# Patient Record
Sex: Female | Born: 1950 | Race: Black or African American | Hispanic: No | Marital: Single | Smoking: Former smoker
Health system: Southern US, Community
[De-identification: ages and names within clinical notes are randomized; demographics above are authoritative.]

## PROBLEM LIST (undated history)

## (undated) DIAGNOSIS — E119 Type 2 diabetes mellitus without complications: Secondary | ICD-10-CM

## (undated) DIAGNOSIS — H9190 Unspecified hearing loss, unspecified ear: Secondary | ICD-10-CM

## (undated) HISTORY — DX: Type 2 diabetes mellitus without complications: E11.9

## (undated) HISTORY — DX: Unspecified hearing loss, unspecified ear: H91.90

---

## 2021-03-27 ENCOUNTER — Ambulatory Visit: Payer: Self-pay | Admitting: Family Medicine

## 2021-07-18 ENCOUNTER — Ambulatory Visit: Payer: Self-pay | Admitting: Family Medicine

## 2021-07-22 ENCOUNTER — Other Ambulatory Visit: Payer: Self-pay

## 2021-07-23 ENCOUNTER — Ambulatory Visit (INDEPENDENT_AMBULATORY_CARE_PROVIDER_SITE_OTHER): Payer: Medicare Other | Admitting: Family Medicine

## 2021-07-23 ENCOUNTER — Encounter: Payer: Self-pay | Admitting: Family Medicine

## 2021-07-23 VITALS — BP 122/70 | HR 82 | Temp 97.5°F | Ht 61.0 in | Wt 185.2 lb

## 2021-07-23 DIAGNOSIS — F209 Schizophrenia, unspecified: Secondary | ICD-10-CM

## 2021-07-23 DIAGNOSIS — E7800 Pure hypercholesterolemia, unspecified: Secondary | ICD-10-CM

## 2021-07-23 DIAGNOSIS — E1165 Type 2 diabetes mellitus with hyperglycemia: Secondary | ICD-10-CM

## 2021-07-23 DIAGNOSIS — R9089 Other abnormal findings on diagnostic imaging of central nervous system: Secondary | ICD-10-CM

## 2021-07-23 DIAGNOSIS — K5901 Slow transit constipation: Secondary | ICD-10-CM

## 2021-07-23 DIAGNOSIS — H6123 Impacted cerumen, bilateral: Secondary | ICD-10-CM

## 2021-07-23 DIAGNOSIS — R930 Abnormal findings on diagnostic imaging of skull and head, not elsewhere classified: Secondary | ICD-10-CM

## 2021-07-23 LAB — URINALYSIS, ROUTINE W REFLEX MICROSCOPIC
Bilirubin Urine: NEGATIVE
Hgb urine dipstick: NEGATIVE
Ketones, ur: NEGATIVE
Leukocytes,Ua: NEGATIVE
Nitrite: NEGATIVE
RBC / HPF: NONE SEEN (ref 0–?)
Specific Gravity, Urine: >=1.03 — AB (ref 1.000–1.030)
Total Protein, Urine: NEGATIVE
Urine Glucose: NEGATIVE
Urobilinogen, UA: 0.2 (ref 0.0–1.0)
pH: 5.5 (ref 5.0–8.0)

## 2021-07-23 LAB — LIPID PANEL
Cholesterol: 155 mg/dL (ref 0–200)
HDL: 54.4 mg/dL (ref >39.00)
LDL Cholesterol: 78 mg/dL (ref 0–99)
NonHDL: 100.77
Total CHOL/HDL Ratio: 3
Triglycerides: 115 mg/dL (ref 0.0–149.0)
VLDL: 23 mg/dL (ref 0.0–40.0)

## 2021-07-23 LAB — COMPREHENSIVE METABOLIC PANEL
ALT: 15 U/L (ref 0–35)
AST: 15 U/L (ref 0–37)
Albumin: 4.6 g/dL (ref 3.5–5.2)
Alkaline Phosphatase: 82 U/L (ref 39–117)
BUN: 13 mg/dL (ref 6–23)
CO2: 32 mEq/L (ref 19–32)
Calcium: 10.1 mg/dL (ref 8.4–10.5)
Chloride: 103 mEq/L (ref 96–112)
Creatinine, Ser: 0.73 mg/dL (ref 0.40–1.20)
GFR: 84.15 mL/min (ref >60.00)
Glucose, Bld: 101 mg/dL — ABNORMAL HIGH (ref 70–99)
Potassium: 4.4 mEq/L (ref 3.5–5.1)
Sodium: 141 mEq/L (ref 135–145)
Total Bilirubin: 0.4 mg/dL (ref 0.2–1.2)
Total Protein: 7.5 g/dL (ref 6.0–8.3)

## 2021-07-23 LAB — LDL CHOLESTEROL, DIRECT: Direct LDL: 84 mg/dL

## 2021-07-23 LAB — CBC
HCT: 39 % (ref 36.0–46.0)
Hemoglobin: 12.2 g/dL (ref 12.0–15.0)
MCHC: 31.3 g/dL (ref 30.0–36.0)
MCV: 80.6 fl (ref 78.0–100.0)
Platelets: 281 10*3/uL (ref 150.0–400.0)
RBC: 4.83 Mil/uL (ref 3.87–5.11)
RDW: 14.3 % (ref 11.5–15.5)
WBC: 7.8 10*3/uL (ref 4.0–10.5)

## 2021-07-23 LAB — MICROALBUMIN / CREATININE URINE RATIO
Creatinine,U: 132.3 mg/dL
Microalb Creat Ratio: 1 mg/g (ref 0.0–30.0)
Microalb, Ur: 1.4 mg/dL (ref 0.0–1.9)

## 2021-07-23 LAB — HEMOGLOBIN A1C: Hgb A1c MFr Bld: 7.4 % — ABNORMAL HIGH (ref 4.6–6.5)

## 2021-07-23 NOTE — Progress Notes (Addendum)
New Patient Office Visit  Subjective:  Patient ID: Margaret Bailey, female    DOB: Jun 07, 1951  Age: 70 y.o. MRN: [id number]  CC:  Chief Complaint  Patient presents with   Establish Care    NP/establish care would like referral to podiatrist to check toe nails.     HPI Margaret Bailey presents for establishment of care follow-up for multiple medical issues to include type 2 diabetes, elevated cholesterol, constipation, presbycusis, ceruminosis and history of abnormal brain MRI from 2 years ago.  Review of her medical record shows that an abnormality has been noted and an MRI of the pituitary gland in 2018.  Patient suffers from schizophrenia and she is seeing a psychiatrist here in [place].  Diabetes has been treated with Glucophage.  She takes simvastatin for elevated cholesterol.  She would like to see a podiatrist to help her trim her nails.  She takes Linzess and Colace for chronic constipation once or twice a week she tells me.  Chart review shows history of neuropathy treated with lidocaine patches and Cymbalta.  She is not certain as to when her last diabetic eye exam was performed.  Moved down here from [place] in September.  She is living with her daughter.  Past Medical History:  Diagnosis Date   Diabetes mellitus without complication ([place])    Hearing loss     Past Surgical History:  Procedure Laterality Date   CESAREAN SECTION      History reviewed. No pertinent family history.  Social History   Socioeconomic History   Marital status: Single    Spouse name: Not on file   Number of children: Not on file   Years of education: Not on file   Highest education level: Not on file  Occupational History   Not on file  Tobacco Use   Smoking status: Former    Types: Cigarettes    Quit date: 08/31/1998    Years since quitting: 22.9   Smokeless tobacco: Never  Vaping Use   Vaping Use: Never used  Substance and Sexual Activity   Alcohol use: Yes    Alcohol/week: 1.0  standard drink    Types: 1 Glasses of wine per week    Comment: rare once a year   Drug use: Never   Sexual activity: Not Currently  Other Topics Concern   Not on file  Social History Narrative   Not on file   Social Determinants of Health   Financial Resource Strain: Not on file  Food Insecurity: Not on file  Transportation Needs: Not on file  Physical Activity: Not on file  Stress: Not on file  Social Connections: Not on file  Intimate Partner Violence: Not on file    ROS Review of Systems  Constitutional:  Negative for chills, diaphoresis, fatigue, fever and unexpected weight change.  HENT: Negative.    Eyes:  Negative for photophobia and visual disturbance.  Respiratory:  Negative for chest tightness, shortness of breath and wheezing.   Cardiovascular: Negative.   Gastrointestinal:  Positive for constipation. Negative for abdominal pain, anal bleeding, blood in stool and diarrhea.  Genitourinary:  Negative for difficulty urinating, frequency and urgency.  Musculoskeletal:  Negative for gait problem and joint swelling.  Neurological:  Negative for speech difficulty and weakness.  Hematological:  Does not bruise/bleed easily.  Depression screen [facility] 2/9 07/23/2021 07/23/2021  Decreased Interest 0 0  Down, Depressed, Hopeless 0 0  PHQ - 2 Score 0 0  Altered sleeping 0 -  Tired, decreased energy 0 -  Change in appetite 0 -  Feeling bad or failure about yourself  0 -  Trouble concentrating 0 -  Moving slowly or fidgety/restless 0 -  Suicidal thoughts 0 -  PHQ-9 Score 0 -  Difficult doing work/chores Not difficult at all -     Objective:   Today's Vitals: BP 122/70 (BP Location: Right Arm, Patient Position: Sitting, Cuff Size: Normal)   Pulse 82   Temp (!) 97.5 F (36.4 C) (Temporal)   Ht 5\' 1"  (1.549 m)   Wt 185 lb 3.2 oz (84 kg)   SpO2 97%   BMI 34.99 kg/m   Physical Exam Vitals and nursing note reviewed.  Constitutional:      General: She is not in  acute distress.    Appearance: Normal appearance. She is not ill-appearing or toxic-appearing.  HENT:     Head: Normocephalic and atraumatic.     Right Ear: External ear normal. Decreased hearing noted. 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     Left Ear: External ear normal. Decreased hearing noted.     Ears:     Comments: There is cerumen gnosis of both ears.  Left appears to be deformed with ceruminosis.  Right TM appears to be deformed with question of perforation.  There is cerumen gnosis in the canal of the right ear..    Mouth/Throat:     Mouth: Mucous membranes are moist.     Pharynx: Oropharynx is clear. No oropharyngeal exudate or posterior oropharyngeal erythema.  Eyes:     General: No scleral icterus.       Right eye: No discharge.        Left eye: No discharge.     Extraocular Movements: Extraocular movements intact.     Conjunctiva/sclera: Conjunctivae normal.     Pupils: Pupils are equal, round, and reactive to light.  Neck:     Vascular: No carotid bruit.  Cardiovascular:     Rate and Rhythm: Normal rate and regular rhythm.     Pulses:          Dorsalis pedis pulses are 2+ on the right side and 2+ on the left side.       Posterior tibial pulses are 2+ on the right side and 2+ on the left side.  Pulmonary:     Effort: Pulmonary effort is normal.     Breath sounds: Normal breath sounds.  Abdominal:     General: Bowel sounds are normal.  Musculoskeletal:     Cervical back: No rigidity or tenderness.     Right lower leg: No edema.     Left lower leg: No edema.  Lymphadenopathy:     Cervical: No cervical adenopathy.  Skin:    General: Skin is warm and dry.  Neurological:     Mental Status: She is alert and oriented to person, place, and time.  Psychiatric:        Mood and Affect: Mood normal.        Behavior: Behavior normal.   Diabetic Foot Exam - Simple   Simple Foot Form Diabetic Foot exam was performed with the following findings: Yes 07/23/2021  1:48 PM   Visual Inspection See comments: Yes Sensation Testing Intact to touch and monofilament testing bilaterally: Yes Pulse Check Posterior Tibialis and Dorsalis pulse intact bilaterally: Yes Comments All nails with polish.     Assessment & Plan:   Problem List Items Addressed This Visit       Nervous and  Auditory   Excessive cerumen in both ear canals - Primary   Relevant Orders   Ambulatory referral to ENT   Other Visit Diagnoses     Type 2 diabetes mellitus with hyperglycemia, without long-term current use of insulin (HCC)       Relevant Medications   metFORMIN (GLUCOPHAGE) 1000 MG tablet   simvastatin (ZOCOR) 20 MG tablet   Other Relevant Orders   CBC (Completed)   Comprehensive metabolic panel (Completed)   Hemoglobin A1c (Completed)   Urinalysis, Routine w reflex microscopic (Completed)   Microalbumin / creatinine urine ratio (Completed)   Ambulatory referral to Podiatry   Ambulatory referral to Ophthalmology   Elevated cholesterol       Relevant Medications   simvastatin (ZOCOR) 20 MG tablet   Other Relevant Orders   Comprehensive metabolic panel (Completed)   LDL cholesterol, direct (Completed)   Lipid panel (Completed)   Slow transit constipation       Schizophrenia, unspecified type (HCC)       Abnormal finding on MRI of brain       Relevant Orders   MR Brain W Wo Contrast (Completed)   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   DULoxetine (CYMBALTA) 60 MG capsule Take 60 mg by mouth daily.   haloperidol (HALDOL) 10 MG tablet Take 10 mg by mouth at bedtime.   LINZESS 145 MCG CAPS capsule Take 145 mcg by mouth daily as needed (constipation).   metFORMIN (GLUCOPHAGE) 1000 MG tablet Take 1,000 mg by  mouth 2 (two) times daily.   simvastatin (ZOCOR) 20 MG tablet Take 20 mg by mouth at bedtime.   No facility-administered encounter medications on file as of 07/23/2021.    Follow-up: Return in about 3 months (around 10/23/2021), or if symptoms worsen or fail to improve.    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  Dedicated study of the sella was recommended.  That has been ordered.  Called patient and left a message on her phone that more detailed studies were needed.  Asked her to follow-up with a virtual with me after the next MRI.

## 2021-07-29 ENCOUNTER — Emergency Department (HOSPITAL_COMMUNITY): Payer: Medicare Other

## 2021-07-29 ENCOUNTER — Other Ambulatory Visit: Payer: Self-pay

## 2021-07-29 ENCOUNTER — Encounter (HOSPITAL_COMMUNITY): Payer: Self-pay | Admitting: Emergency Medicine

## 2021-07-29 ENCOUNTER — Observation Stay (HOSPITAL_COMMUNITY)
Admission: EM | Admit: 2021-07-29 | Discharge: 2021-07-30 | Disposition: A | Discharge status: Home / Self Care | Payer: Medicare Other | Attending: Internal Medicine | Admitting: Internal Medicine

## 2021-07-29 DIAGNOSIS — Z87891 Personal history of nicotine dependence: Secondary | ICD-10-CM | POA: Insufficient documentation

## 2021-07-29 DIAGNOSIS — Z79899 Other long term (current) drug therapy: Secondary | ICD-10-CM | POA: Diagnosis not present

## 2021-07-29 DIAGNOSIS — R6510 Systemic inflammatory response syndrome (SIRS) of non-infectious origin without acute organ dysfunction: Secondary | ICD-10-CM | POA: Diagnosis present

## 2021-07-29 DIAGNOSIS — N179 Acute kidney failure, unspecified: Secondary | ICD-10-CM | POA: Diagnosis not present

## 2021-07-29 DIAGNOSIS — E1169 Type 2 diabetes mellitus with other specified complication: Secondary | ICD-10-CM | POA: Diagnosis present

## 2021-07-29 DIAGNOSIS — E871 Hypo-osmolality and hyponatremia: Secondary | ICD-10-CM | POA: Insufficient documentation

## 2021-07-29 DIAGNOSIS — Z7984 Long term (current) use of oral hypoglycemic drugs: Secondary | ICD-10-CM | POA: Insufficient documentation

## 2021-07-29 DIAGNOSIS — E785 Hyperlipidemia, unspecified: Secondary | ICD-10-CM | POA: Diagnosis present

## 2021-07-29 DIAGNOSIS — T781XXA Other adverse food reactions, not elsewhere classified, initial encounter: Secondary | ICD-10-CM | POA: Diagnosis present

## 2021-07-29 DIAGNOSIS — Z20822 Contact with and (suspected) exposure to covid-19: Secondary | ICD-10-CM | POA: Diagnosis not present

## 2021-07-29 DIAGNOSIS — R21 Rash and other nonspecific skin eruption: Secondary | ICD-10-CM | POA: Diagnosis not present

## 2021-07-29 DIAGNOSIS — D72829 Elevated white blood cell count, unspecified: Secondary | ICD-10-CM | POA: Diagnosis not present

## 2021-07-29 DIAGNOSIS — E872 Acidosis, unspecified: Secondary | ICD-10-CM

## 2021-07-29 DIAGNOSIS — T7840XA Allergy, unspecified, initial encounter: Secondary | ICD-10-CM

## 2021-07-29 DIAGNOSIS — E119 Type 2 diabetes mellitus without complications: Secondary | ICD-10-CM | POA: Diagnosis not present

## 2021-07-29 DIAGNOSIS — E7800 Pure hypercholesterolemia, unspecified: Secondary | ICD-10-CM | POA: Diagnosis present

## 2021-07-29 DIAGNOSIS — E1165 Type 2 diabetes mellitus with hyperglycemia: Secondary | ICD-10-CM | POA: Diagnosis present

## 2021-07-29 LAB — BASIC METABOLIC PANEL
Anion gap: 13 (ref 5–15)
BUN: 21 mg/dL (ref 8–23)
CO2: 19 mmol/L — ABNORMAL LOW (ref 22–32)
Calcium: 9.2 mg/dL (ref 8.9–10.3)
Chloride: 98 mmol/L (ref 98–111)
Creatinine, Ser: 1.69 mg/dL — ABNORMAL HIGH (ref 0.44–1.00)
GFR, Estimated: 32 mL/min — ABNORMAL LOW (ref >60)
Glucose, Bld: 298 mg/dL — ABNORMAL HIGH (ref 70–99)
Potassium: 4.7 mmol/L (ref 3.5–5.1)
Sodium: 130 mmol/L — ABNORMAL LOW (ref 135–145)

## 2021-07-29 LAB — HEPATIC FUNCTION PANEL
ALT: 15 U/L (ref 0–44)
AST: 23 U/L (ref 15–41)
Albumin: 3.8 g/dL (ref 3.5–5.0)
Alkaline Phosphatase: 77 U/L (ref 38–126)
Bilirubin, Direct: 0.1 mg/dL (ref 0.0–0.2)
Indirect Bilirubin: 0.7 mg/dL (ref 0.3–0.9)
Total Bilirubin: 0.8 mg/dL (ref 0.3–1.2)
Total Protein: 7.5 g/dL (ref 6.5–8.1)

## 2021-07-29 LAB — CBG MONITORING, ED: Glucose-Capillary: 169 mg/dL — ABNORMAL HIGH (ref 70–99)

## 2021-07-29 LAB — CBC
HCT: 45.2 % (ref 36.0–46.0)
Hemoglobin: 14.7 g/dL (ref 12.0–15.0)
MCH: 25.8 pg — ABNORMAL LOW (ref 26.0–34.0)
MCHC: 32.5 g/dL (ref 30.0–36.0)
MCV: 79.3 fL — ABNORMAL LOW (ref 80.0–100.0)
Platelets: 361 10*3/uL (ref 150–400)
RBC: 5.7 MIL/uL — ABNORMAL HIGH (ref 3.87–5.11)
RDW: 13.9 % (ref 11.5–15.5)
WBC: 22.1 10*3/uL — ABNORMAL HIGH (ref 4.0–10.5)
nRBC: 0 % (ref 0.0–0.2)

## 2021-07-29 LAB — RESP PANEL BY RT-PCR (FLU A&B, COVID) ARPGX2
Influenza A by PCR: NEGATIVE
Influenza B by PCR: NEGATIVE
SARS Coronavirus 2 by RT PCR: NEGATIVE

## 2021-07-29 LAB — URINALYSIS, ROUTINE W REFLEX MICROSCOPIC
Bilirubin Urine: NEGATIVE
Glucose, UA: NEGATIVE mg/dL
Hgb urine dipstick: NEGATIVE
Ketones, ur: NEGATIVE mg/dL
Leukocytes,Ua: NEGATIVE
Nitrite: NEGATIVE
Protein, ur: NEGATIVE mg/dL
Specific Gravity, Urine: >1.03 — ABNORMAL HIGH (ref 1.005–1.030)
pH: 5.5 (ref 5.0–8.0)

## 2021-07-29 LAB — LACTIC ACID, PLASMA
Lactic Acid, Venous: 3.7 mmol/L (ref 0.5–1.9)
Lactic Acid, Venous: 2.3 mmol/L (ref 0.5–1.9)

## 2021-07-29 LAB — TROPONIN I (HIGH SENSITIVITY): Troponin I (High Sensitivity): 18 ng/L — ABNORMAL HIGH (ref <18)

## 2021-07-29 LAB — LIPASE, BLOOD: Lipase: 25 U/L (ref 11–51)

## 2021-07-29 MED ORDER — ACETAMINOPHEN 325 MG PO TABS: 650.0000 mg | ORAL_TABLET | Freq: Four times a day (QID) | ORAL | Status: DC | PRN

## 2021-07-29 MED ORDER — ONDANSETRON HCL 4 MG PO TABS: 4.0000 mg | ORAL_TABLET | Freq: Four times a day (QID) | ORAL | Status: DC | PRN

## 2021-07-29 MED ORDER — SODIUM CHLORIDE 0.9 % IV SOLN
INTRAVENOUS | Status: AC
  Filled 2021-07-29: qty 2

## 2021-07-29 MED ORDER — ONDANSETRON HCL 4 MG/2ML IJ SOLN
4.0000 mg | Freq: Once | INTRAMUSCULAR | Status: AC
  Administered 2021-07-29: 4 mg via INTRAVENOUS

## 2021-07-29 MED ORDER — DICLOFENAC SODIUM 1 % EX GEL: 1.0000 "application " | Freq: Four times a day (QID) | CUTANEOUS | Status: DC | PRN

## 2021-07-29 MED ORDER — SENNOSIDES-DOCUSATE SODIUM 8.6-50 MG PO TABS: 1.0000 | ORAL_TABLET | Freq: Every evening | ORAL | Status: DC | PRN

## 2021-07-29 MED ORDER — POTASSIUM CHLORIDE IN NACL 40-0.9 MEQ/L-% IV SOLN
INTRAVENOUS | Status: AC
  Filled 2021-07-29: qty 1000

## 2021-07-29 MED ORDER — HALOPERIDOL 5 MG PO TABS
10.0000 mg | ORAL_TABLET | Freq: Every day | ORAL | Status: DC
  Administered 2021-07-30: 10 mg via ORAL
  Filled 2021-07-29: qty 2

## 2021-07-29 MED ORDER — SODIUM CHLORIDE 0.9 % IV BOLUS
500.0000 mL | Freq: Once | INTRAVENOUS | Status: AC
  Administered 2021-07-29: 500 mL via INTRAVENOUS

## 2021-07-29 MED ORDER — DIPHENHYDRAMINE HCL 25 MG PO CAPS: 25.0000 mg | ORAL_CAPSULE | Freq: Four times a day (QID) | ORAL | Status: DC | PRN

## 2021-07-29 MED ORDER — SODIUM CHLORIDE 0.9% FLUSH
3.0000 mL | Freq: Two times a day (BID) | INTRAVENOUS | Status: DC
  Administered 2021-07-30: 3 mL via INTRAVENOUS

## 2021-07-29 MED ORDER — INSULIN ASPART 100 UNIT/ML IJ SOLN: 0.0000 [IU] | Freq: Three times a day (TID) | INTRAMUSCULAR | Status: DC

## 2021-07-29 MED ORDER — ENOXAPARIN SODIUM 40 MG/0.4ML IJ SOSY
40.0000 mg | PREFILLED_SYRINGE | Freq: Every day | INTRAMUSCULAR | Status: DC
  Administered 2021-07-30: 40 mg via SUBCUTANEOUS
  Filled 2021-07-29: qty 0.4

## 2021-07-29 MED ORDER — ONDANSETRON HCL 4 MG/2ML IJ SOLN: 4.0000 mg | Freq: Four times a day (QID) | INTRAMUSCULAR | Status: DC | PRN

## 2021-07-29 MED ORDER — ACETAMINOPHEN 650 MG RE SUPP: 650.0000 mg | Freq: Four times a day (QID) | RECTAL | Status: DC | PRN

## 2021-07-29 MED ORDER — METRONIDAZOLE 500 MG/100ML IV SOLN
500.0000 mg | Freq: Once | INTRAVENOUS | Status: AC
  Administered 2021-07-29: 500 mg via INTRAVENOUS

## 2021-07-29 MED ORDER — SODIUM CHLORIDE 0.9 % IV SOLN: 2.0000 g | Freq: Two times a day (BID) | INTRAVENOUS | Status: DC

## 2021-07-29 MED ORDER — LACTATED RINGERS IV SOLN: INTRAVENOUS | Status: DC

## 2021-07-29 MED ORDER — INSULIN ASPART 100 UNIT/ML IJ SOLN
8.0000 [IU] | Freq: Once | INTRAMUSCULAR | Status: AC
  Administered 2021-07-29: 8 [IU] via INTRAVENOUS

## 2021-07-29 MED ORDER — METHYLPREDNISOLONE SODIUM SUCC 125 MG IJ SOLR
60.0000 mg | Freq: Once | INTRAMUSCULAR | Status: AC
  Administered 2021-07-29: 60 mg via INTRAVENOUS

## 2021-07-29 MED ORDER — DULOXETINE HCL 60 MG PO CPEP
60.0000 mg | ORAL_CAPSULE | Freq: Every day | ORAL | Status: DC
  Filled 2021-07-29: qty 1

## 2021-07-29 MED ORDER — LINACLOTIDE 145 MCG PO CAPS: 145.0000 ug | ORAL_CAPSULE | Freq: Every day | ORAL | Status: DC | PRN

## 2021-07-29 MED ORDER — VANCOMYCIN HCL 1750 MG/350ML IV SOLN
1750.0000 mg | Freq: Once | INTRAVENOUS | Status: AC
  Administered 2021-07-30: 1750 mg via INTRAVENOUS
  Filled 2021-07-29: qty 350

## 2021-07-29 MED ORDER — SIMVASTATIN 20 MG PO TABS
20.0000 mg | ORAL_TABLET | Freq: Every day | ORAL | Status: DC
  Administered 2021-07-30: 20 mg via ORAL
  Filled 2021-07-29: qty 1

## 2021-07-29 MED ORDER — SODIUM CHLORIDE 0.9 % IV SOLN
2.0000 g | Freq: Once | INTRAVENOUS | Status: AC
  Administered 2021-07-29: 2 g via INTRAVENOUS

## 2021-07-29 MED ORDER — DIPHENHYDRAMINE HCL 50 MG/ML IJ SOLN
25.0000 mg | Freq: Once | INTRAMUSCULAR | Status: AC
  Administered 2021-07-29: 25 mg via INTRAVENOUS

## 2021-07-29 MED ORDER — VANCOMYCIN VARIABLE DOSE PER UNSTABLE RENAL FUNCTION (PHARMACIST DOSING): Status: DC

## 2021-07-29 MED ORDER — FAMOTIDINE IN NACL 20-0.9 MG/50ML-% IV SOLN
20.0000 mg | Freq: Once | INTRAVENOUS | Status: AC
  Administered 2021-07-29: 20 mg via INTRAVENOUS

## 2021-07-29 MED ORDER — ACETAMINOPHEN 500 MG PO TABS
1000.0000 mg | ORAL_TABLET | Freq: Once | ORAL | Status: AC
  Administered 2021-07-29: 1000 mg via ORAL

## 2021-07-29 NOTE — ED Notes (Signed)
Antibiotic started prior to first blood culture so not to delay administration. Labs delayed due to code in department.

## 2021-07-29 NOTE — H&P (Signed)
Non  History and Physical    Margaret Bailey YFV:[id number] DOB: 19-Feb-1951 DOA: 07/29/2021  PCP: Libby Maw, MD  Patient coming from: Home  I have personally briefly reviewed patient's old medical records in [place]  Chief Complaint: Pruritus, hand swelling  HPI: Margaret Bailey is a 70 y.o. female with medical history significant for type 2 diabetes, hyperlipidemia, mood disorder who presented to the ED for evaluation of pruritus and swelling in her hands.  Patient reports a food allergy to lemon.  She says 2 days ago she ate a lemon pepper.  Afterwards she began to feel itchy all over.  She took Benadryl with some relief.  The following day she began to have significant swelling to both of her hands as well as erythema on her palms.  Her daughter noticed that she had erythema of her upper extremities as well as some erythematous changes on her chest.  She then developed scattered flat red spots on her extremities.  She has had some lightheadedness, dizziness which she describes as vertigo but has not lost consciousness or fallen.  She said she felt some difficulty swallowing earlier today but did not feel like she was having any swelling in her lips, tongue, or throat.  She had some nausea without significant emesis.  She says that she has been having some exertional dyspnea.  She has also had some bandlike chest discomfort earlier today.  Patient states that her hand swelling and erythema have now resolved after receiving IV steroids, Pepcid, Benadryl while in the ED.  She still has some pruritus.  She denies any similar reaction in the past.  She otherwise denies any subjective fevers, chills, diaphoresis, cough, abdominal pain, dysuria, diarrhea, or swelling in her legs.  ED Course:  Initial vitals showed BP 104/74, pulse 121, RR 22, temp 98.3 F, SPO2 95% on room air.  T-max 99.9 F while in the ED.  Labs show WBC 22.1, hemoglobin 14.7, platelets 361,000, sodium  130 (135 when corrected for hyperglycemia), potassium 4.7, bicarb 19, BUN 21, creatinine 1.69 (previously 0.73 on 07/23/2021), serum glucose 298, anion gap 13, lipase 25, LFTs within normal limits, lactic acid 3.7.  Blood cultures collected and in process.  Urinalysis and culture ordered and pending collection.  SARS-CoV-2 PCR panel in process.  2 view chest x-ray shows mild elevation of right hemidiaphragm without focal consolidation, edema, effusion.  Patient was given 500 cc normal saline x2 followed by LR@150  mL/hour, 8 units IV NovoLog, 60 mg IV Solu-Medrol, IV Pepcid, Zofran, Tylenol, IV Benadryl.  Patient started on empiric antibiotics with IV vancomycin, cefepime, and Flagyl.  The hospitalist service was consulted to admit for further evaluation of SIRS and AKI.  Review of Systems: All systems reviewed and are negative except as documented in history of present illness above.   Past Medical History:  Diagnosis Date   Diabetes mellitus without complication ([place])    Hearing loss     Past Surgical History:  Procedure Laterality Date   CESAREAN SECTION      Social History:  reports that she quit smoking about 22 years ago. Her smoking use included cigarettes. She has never used smokeless tobacco. She reports current alcohol use of about 1.0 standard drink per week. She reports that she does not use drugs.  Allergies  Allergen Reactions   Lemon Oil Itching    No family history on file.   Prior to Admission medications   Medication Sig Start Date End Date Taking? Authorizing  Provider  diclofenac Sodium (VOLTAREN) 1 % GEL Apply 1 application topically 4 (four) times daily. 04/28/21   [provider]  DULoxetine (CYMBALTA) 60 MG capsule Take 60 mg by mouth daily. 07/15/21   [provider]  haloperidol (HALDOL) 10 MG tablet Take 10 mg by mouth at bedtime. 06/17/21   [provider]  LINZESS 145 MCG CAPS capsule Take 145 mcg by mouth daily. 04/28/21    [provider]  metFORMIN (GLUCOPHAGE) 1000 MG tablet Take 1,000 mg by mouth 2 (two) times daily. 04/19/21   [provider]  simvastatin (ZOCOR) 20 MG tablet Take 20 mg by mouth at bedtime. 04/19/21   [provider]    Physical Exam: Vitals:   07/29/21 1346 07/29/21 1635 07/29/21 1921 07/29/21 2208  BP: 123/73 115/79 125/86 (!) 141/67  Pulse: (!) 120 (!) 113 (!) 114 (!) 104  Resp: 16 14 16 19   Temp:   99.9 F (37.7 C)   TempSrc:   Oral   SpO2: 98% 99% 99% 98%   Constitutional: Resting in bed, NAD, calm, comfortable Eyes: PERRL, lids and conjunctivae normal ENMT: Mucous membranes are moist. Posterior pharynx clear of any exudate or lesions.dentures in place. Neck: normal, supple, no masses. Respiratory: clear to auscultation bilaterally, no wheezing, no crackles. Normal respiratory effort. No accessory muscle use.  Cardiovascular: Tachycardic with regular rhythm, no murmurs / rubs / gallops. No extremity edema. 2+ pedal pulses. Abdomen: no tenderness, no masses palpated. No hepatosplenomegaly. Bowel sounds positive.  Musculoskeletal: no clubbing / cyanosis. No joint deformity upper and lower extremities. Good ROM, no contractures. Normal muscle tone.  Skin: no rashes, lesions, ulcers. No induration Neurologic: CN 2-12 grossly intact. Sensation intact. Strength 5/5 in all 4.  Psychiatric: Normal judgment and insight. Alert and oriented x 3. Normal mood.   Labs on Admission: I have personally reviewed following labs and imaging studies  CBC: Recent Labs  Lab 07/23/21 1355 07/29/21 1137  WBC 7.8 22.1*  HGB 12.2 14.7  HCT 39.0 45.2  MCV 80.6 79.3*  PLT 281.0 144   Basic Metabolic Panel: Recent Labs  Lab 07/23/21 1355 07/29/21 1137  NA 141 130*  K 4.4 4.7  CL 103 98  CO2 32 19*  GLUCOSE 101* 298*  BUN 13 21  CREATININE 0.73 1.69*  CALCIUM 10.1 9.2   GFR: Estimated Creatinine Clearance: 30.9 mL/min (A) (by C-G formula based on SCr of 1.69  mg/dL (H)). Liver Function Tests: Recent Labs  Lab 07/23/21 1355 07/29/21 1742  AST 15 23  ALT 15 15  ALKPHOS 82 77  BILITOT 0.4 0.8  PROT 7.5 7.5  ALBUMIN 4.6 3.8   Recent Labs  Lab 07/29/21 1742  LIPASE 25   No results for input(s): AMMONIA in the last 168 hours. Coagulation Profile: No results for input(s): INR, PROTIME in the last 168 hours. Cardiac Enzymes: No results for input(s): CKTOTAL, CKMB, CKMBINDEX, TROPONINI in the last 168 hours. BNP (last 3 results) No results for input(s): PROBNP in the last 8760 hours. HbA1C: No results for input(s): HGBA1C in the last 72 hours. CBG: Recent Labs  Lab 07/29/21 2142  GLUCAP 169*   Lipid Profile: No results for input(s): CHOL, HDL, LDLCALC, TRIG, CHOLHDL, LDLDIRECT in the last 72 hours. Thyroid Function Tests: No results for input(s): TSH, T4TOTAL, FREET4, T3FREE, THYROIDAB in the last 72 hours. Anemia Panel: No results for input(s): VITAMINB12, FOLATE, FERRITIN, TIBC, IRON, RETICCTPCT in the last 72 hours. Urine analysis:    Component  Value Date/Time   COLORURINE YELLOW 07/23/2021 [place] 07/23/2021 1355   LABSPEC >=1.030 (A) 07/23/2021 1355   PHURINE 5.5 07/23/2021 1355   GLUCOSEU NEGATIVE 07/23/2021 1355   HGBUR NEGATIVE 07/23/2021 [place] 07/23/2021 [place] 07/23/2021 1355   UROBILINOGEN 0.2 07/23/2021 1355   NITRITE NEGATIVE 07/23/2021 1355   LEUKOCYTESUR NEGATIVE 07/23/2021 1355    Radiological Exams on Admission: DG Chest 2 View  Result Date: 07/29/2021 CLINICAL DATA:  Fever. EXAM: CHEST - 2 VIEW COMPARISON:  None. FINDINGS: There is mild eventration of the right hemidiaphragm. No focal consolidation, pleural effusion, pneumothorax. The cardiac silhouette is within normal limits. No acute osseous pathology. IMPRESSION: No active cardiopulmonary disease. Electronically Signed   By: Anner Crete M.D.   On: 07/29/2021 21:23    EKG: Personally  reviewed. Sinus tachycardia, rate 108, no acute ischemic changes.  No prior for comparison.  Assessment/Plan Principal Problem:   SIRS (systemic inflammatory response syndrome) (HCC) Active Problems:   AKI (acute kidney injury) ([place])   Type 2 diabetes mellitus with hyperglycemia ([place])   Hyperlipidemia associated with type 2 diabetes mellitus (HCC)   Shaquera Ansley is a 70 y.o. female with medical history significant for type 2 diabetes, hyperlipidemia, mood disorder who is admitted with SIRS and AKI.  SIRS  Possible food allergic reaction: Patient presenting with tachycardia, tachypnea, leukocytosis, lactic acidosis and AKI without obvious infectious source.  History suggestive of possible non-IgE mediated allergic reaction to food.  Reported hand swelling, erythema and rash of upper extremities, and pruritus have resolved.  CXR without evidence of pneumonia.  COVID and influenza negative.  Urinalysis pending although she denies any urinary symptoms.  She does not have any abdominal symptoms or tenderness on exam. -S/p IV vancomycin, cefepime, Flagyl; hold further antibiotics -Follow blood cultures, urinalysis -Check CBC with differential -Benadryl as needed  Acute kidney injury: Creatinine 1.69 compared to 0.73 on 11/23.  Suspect related to above.  Continue IV fluid hydration and repeat labs in AM.  Type 2 diabetes with hyperglycemia: Hyperglycemic on admission without evidence of DKA or HHS.  Hemoglobin A1c 7.4% on 07/23/2021.  Hold metformin and place on sensitive SSI.  Hyperlipidemia: Continue simvastatin.  Mood disorder: Patient with reported history of schizophrenia, follows with psychiatry locally.  Mood currently stable/appropriate.  Continue home Haldol and Cymbalta.  DVT prophylaxis: Lovenox Code Status: Full code, confirmed on admission Family Communication: Discussed with patient's daughter at bedside Disposition Plan: From home and likely discharged home pending  clinical progress Consults called: None Level of care: Telemetry Medical Admission status:  Status is: Observation  The patient remains OBS appropriate and will d/c before 2 midnights.  Zada Finders MD [facility]  If 7PM-7AM, please contact night-coverage [email or web address]  07/29/2021, 10:30 PM

## 2021-07-29 NOTE — ED Provider Notes (Signed)
Emergency Medicine Provider Triage Evaluation Note  Margaret Bailey , a 70 y.o. female  was evaluated in triage.  Pt complains of itchiness and rash since yesterday.  Patient reports she tried Benadryl at 0845 this morning without relief.  Patient reports she had lemon pepper on Sunday and is allergic to lemons.  She denies any trouble swallowing, reports some shortness of breath.  Patient reports she does have history of vertigo.  Denies any chest pain  Review of Systems  Positive: Shortness of breath, itchiness, rash, dizziness Negative: Chest pain, trouble swallowing, drooling  Physical Exam  BP 104/74 (BP Location: Right Arm)   Pulse (!) 121   Temp 98.3 F (36.8 C) (Oral)   Resp (!) 22   SpO2 95%  Gen:   Awake, no distress   Resp:  Normal effort  MSK:   Moves extremities without difficulty  Other:  Excoriation marks with redness to bilateral palms and dorsum of hands along with few skin spots on arms and abdomen.  Rest of skin exam deferred at this time.  Medical Decision Making  Medically screening exam initiated at 11:23 AM.  Appropriate orders placed.  Margaret Bailey was informed that the remainder of the evaluation will be completed by another provider, this initial triage assessment does not replace that evaluation, and the importance of remaining in the ED until their evaluation is complete.  Dizziness order set placed.  Medications deferred at this time due to lower blood pressure.   Sherrell Puller, PA-C 07/29/21 1125    Carmin Muskrat, MD 08/02/21 1740

## 2021-07-29 NOTE — ED Notes (Signed)
Pt still c/o itching all over despite the benadryl. Did not appreciate any rash.

## 2021-07-29 NOTE — ED Triage Notes (Signed)
C/o feeling itchy all over, redness and swelling to hands x 2 days since eating lemon pepper.  Allergy to lemons.  Also reports vertigo.  Hx of vertigo.  Denies SOB.  No facial swelling.  Took Benadryl at 8:45am.

## 2021-07-29 NOTE — Progress Notes (Signed)
Pharmacy Antibiotic Note  Margaret Bailey is a 70 y.o. female admitted on 07/29/2021 with sepsis.  Pharmacy has been consulted for vancomycin and cefepime dosing. Patient presenting with diffuse itching and rash with hand swelling over the past 2 days.  Patient's serum creatinine is 1.69 which is significantly above baseline. WBC 22.1; LA 3.7; T 99.9 F  Plan: Flagyl per MD Cefepime 2g q12h Vancomycin 1750 mg once, subsequent dosing as indicated per random vancomycin level until renal function stable and/or improved, at which time scheduled dosing can be considered Trend WBC, fever, renal function, and clinical course Follow-up cultures and de-escalate antibiotics as appropriate.     Temp (24hrs), Avg:99.1 F (37.3 C), Min:98.3 F (36.8 C), Max:99.9 F (37.7 C)  Recent Labs  Lab 07/23/21 1355 07/29/21 1137 07/29/21 1747  WBC 7.8 22.1*  --   CREATININE 0.73 1.69*  --   LATICACIDVEN  --   --  3.7*    Estimated Creatinine Clearance: 30.9 mL/min (A) (by C-G formula based on SCr of 1.69 mg/dL (H)).    Not on File  Antimicrobials this admission: Cefepime 11/29 >>  Flagyl 11/29 >>  Vancomycin 11/29 >>   Microbiology results: Pending  Thank you for allowing pharmacy to be a part of this patient's care.  Lorelei Pont, PharmD, BCPS 07/29/2021 8:18 PM ED Clinical Pharmacist -  [phone]

## 2021-07-29 NOTE — ED Notes (Signed)
Pt started c/o pain like a band around her chest/upper abd. EDP notified. EKG obtained and new orders received.

## 2021-07-29 NOTE — ED Provider Notes (Signed)
Emergency Department Provider Note   I have reviewed the triage vital signs and the nursing notes.   HISTORY  Chief Complaint Allergic Reaction   HPI Margaret Bailey is a 70 y.o. female with past medical history reviewed below presents to the emergency department with diffuse itching and rash with hand swelling over the past 2 days.  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  She developed itching and rash shortly afterwards.  She is had some puffiness to the hands.  She is not having any throat tightness/swelling, tongue swelling, shortness of breath.  No abdominal pain or gastrointestinal symptom such as vomiting or diarrhea.  No known prior history of anaphylaxis or EpiPen use.  She is not been having fevers.  She not feeling chest pain or shortness of breath.  She came in due to swelling in the hands as well as diffuse itching.  Her last Benadryl was at 8:45 this morning.    Past Medical History:  Diagnosis Date   Diabetes mellitus without complication ([place])    Hearing loss     Patient Active Problem List   Diagnosis Date Noted   SIRS (systemic inflammatory response syndrome) ([place]) 07/29/2021   AKI (acute kidney injury) ([place]) 07/29/2021   Type 2 diabetes mellitus with hyperglycemia ([place]) 07/29/2021   Hyperlipidemia associated with type 2 diabetes mellitus ([place]) 07/29/2021   Excessive cerumen in both ear canals 07/23/2021    Past Surgical History:  Procedure Laterality Date   CESAREAN SECTION      Allergies Lemon oil  No family history on file.  Social History Social History   Tobacco Use   Smoking status: Former    Types: Cigarettes    Quit date: 08/31/1998    Years since quitting: 22.9   Smokeless tobacco: Never  Vaping Use   Vaping Use: Never used  Substance Use Topics   Alcohol use: Yes    Alcohol/week: 1.0 standard drink    Types: 1 Glasses of wine per week    Comment: rare once a year   Drug use:  Never    Review of Systems  Constitutional: No fever/chills Eyes: No visual changes. ENT: No sore throat. Cardiovascular: Denies chest pain. Respiratory: Denies shortness of breath. Gastrointestinal: No abdominal pain.  No nausea, no vomiting.  No diarrhea.  No constipation. Genitourinary: Negative for dysuria. Musculoskeletal: Negative for back pain. Skin: Itching, bilateral hand swelling, and rash.  Neurological: Negative for headaches, focal weakness or numbness.  10-point ROS otherwise negative.  ____________________________________________   PHYSICAL EXAM:  VITAL SIGNS: ED Triage Vitals  Enc Vitals Group     BP 07/29/21 1037 104/74     Pulse Rate 07/29/21 1037 (!) 121     Resp 07/29/21 1037 (!) 22     Temp 07/29/21 1037 98.3 F (36.8 C)     Temp Source 07/29/21 1037 Oral     SpO2 07/29/21 1037 95 %   Constitutional: Alert and oriented. Well appearing and in no acute distress. Eyes: Conjunctivae are normal.  Head: Atraumatic. Nose: No congestion/rhinnorhea. Mouth/Throat: Mucous membranes are moist.  No angioedema.  Neck: No stridor.  Cardiovascular: Tachycardia. Good peripheral circulation. Grossly normal heart sounds.   Respiratory: Normal respiratory effort.  No retractions. Lungs CTAB. Gastrointestinal: Soft and nontender. No distention.  Musculoskeletal: No lower extremity tenderness nor edema. No gross deformities of extremities. Mild hand swelling with faint, patchy erythema.  Neurologic:  Normal speech and language. No gross  focal neurologic deficits are appreciated.  Skin:  Skin is warm and dry. No hives. Faint, patchy rash as above but not raised. No oral lesions. No petechiae.    ____________________________________________   LABS (all labs ordered are listed, but only abnormal results are displayed)  Labs Reviewed  BASIC METABOLIC PANEL - Abnormal; Notable for the following components:      Result Value   Sodium 130 (*)    CO2 19 (*)     Glucose, Bld 298 (*)    Creatinine, Ser 1.69 (*)    GFR, Estimated 32 (*)    All other components within normal limits  CBC - Abnormal; Notable for the following components:   WBC 22.1 (*)    RBC 5.70 (*)    MCV 79.3 (*)    MCH 25.8 (*)    All other components within normal limits  URINALYSIS, ROUTINE W REFLEX MICROSCOPIC - Abnormal; Notable for the following components:   Specific Gravity, Urine >1.030 (*)    All other components within normal limits  LACTIC ACID, PLASMA - Abnormal; Notable for the following components:   Lactic Acid, Venous 3.7 (*)    All other components within normal limits  LACTIC ACID, PLASMA - Abnormal; Notable for the following components:   Lactic Acid, Venous 2.3 (*)    All other components within normal limits  CBG MONITORING, ED - Abnormal; Notable for the following components:   Glucose-Capillary 169 (*)    All other components within normal limits  TROPONIN I (HIGH SENSITIVITY) - Abnormal; Notable for the following components:   Troponin I (High Sensitivity) 18 (*)    All other components within normal limits  RESP PANEL BY RT-PCR (FLU A&B, COVID) ARPGX2  CULTURE, BLOOD (ROUTINE X 2)  CULTURE, BLOOD (ROUTINE X 2)  URINE CULTURE  CULTURE, BLOOD (SINGLE)  HEPATIC FUNCTION PANEL  LIPASE, BLOOD  HIV ANTIBODY (ROUTINE TESTING W REFLEX)  BASIC METABOLIC PANEL  CBC WITH DIFFERENTIAL/PLATELET  LACTIC ACID, PLASMA  CBG MONITORING, ED  TROPONIN I (HIGH SENSITIVITY)   ____________________________________________  EKG   EKG Interpretation  Date/Time:  Tuesday July 29 2021 11:45:21 EST Ventricular Rate:  108 PR Interval:  154 QRS Duration: 60 QT Interval:  310 QTC Calculation: 415 R Axis:   -10 Text Interpretation: Sinus tachycardia Cannot rule out Anterior infarct , age undetermined Abnormal ECG Confirmed by Nanda Quinton [phone] on 07/29/2021 4:36:02 PM        ____________________________________________  RADIOLOGY  DG Chest 2  View  Result Date: 07/29/2021 CLINICAL DATA:  Fever. EXAM: CHEST - 2 VIEW COMPARISON:  None. FINDINGS: There is mild eventration of the right hemidiaphragm. No focal consolidation, pleural effusion, pneumothorax. The cardiac silhouette is within normal limits. No acute osseous pathology. IMPRESSION: No active cardiopulmonary disease. Electronically Signed   By: Anner Crete M.D.   On: 07/29/2021 21:23    ____________________________________________   PROCEDURES  Procedure(s) performed:   Procedures  CRITICAL CARE Performed by: Margette Fast Total critical care time: 35 minutes Critical care time was exclusive of separately billable procedures and treating other patients. Critical care was necessary to treat or prevent imminent or life-threatening deterioration. Critical care was time spent personally by me on the following activities: development of treatment plan with patient and/or surrogate as well as nursing, discussions with consultants, evaluation of patient's response to treatment, examination of patient, obtaining history from patient or surrogate, ordering and performing treatments and interventions, ordering and review of laboratory studies, ordering and review  of radiographic studies, pulse oximetry and re-evaluation of patient's condition.  Nanda Quinton, MD Emergency Medicine  ____________________________________________   INITIAL IMPRESSION / ASSESSMENT AND PLAN / ED COURSE  Pertinent labs & imaging results that were available during my care of the patient were reviewed by me and considered in my medical decision making (see chart for details).   Presents to the emergency department with allergic reaction type symptoms over the past 2 days.  She has been taking Benadryl without relief and itching and is having some swelling in the hands.  No angioedema, hypotension, subjective throat or GI symptoms to strongly suspect anaphylaxis.  The potential exposure was 2 days  prior.  No new medications.  This does not seem consistent with SJS/TEN although this was considered.  On arrival, the patient was tachycardic to 121 without fever.  No hypotension.  Lab work was ordered in the setting of tachycardia and was found to have a leukocytosis of 22.  She is not having any fever symptoms.  Doubt developing sepsis.  This may be acute phase reactant in the setting of allergic type reaction.  I do not see any skin changes to suspect SJS/TN.  No oral mucosal changes.  She is awake and alert.  Pseudohyponatremia with hyperglycemia but no DKA. Will give insulin as well. Considered risk/benefit of one time steroid dose here in the setting of hyperglycemia. Patient will continue to follow her CBG closely and take her meds. Do not plan of steroid burst.   07:45 PM  Patient's repeat vital signs show continued tachycardia and now with borderline fever at 99.9 F.  She continues to look very well.  She is sitting in a hallway bed, on her phone, conversational.  She is not having abdominal pain.  Her itching is improved after medications here.  Sending her for chest x-ray and will also send for COVID/flu testing.  With her lactate now at 3.7, leukocytosis of 22, borderline fever, persistent tachycardia I have started antibiotics.   Discussed patient's case with [facility] to request admission. Patient and family (if present) updated with plan. Care transferred to [facility] service.  I reviewed all nursing notes, vitals, pertinent old records, EKGs, labs, imaging (as available).  ____________________________________________  FINAL CLINICAL IMPRESSION(S) / ED DIAGNOSES  Final diagnoses:  Lactic acidosis  Allergic reaction, initial encounter     MEDICATIONS GIVEN DURING THIS VISIT:  Medications  lactated ringers infusion ( Intravenous New Bag/Given 07/29/21 2249)  vancomycin (VANCOREADY) IVPB 1750 mg/350 mL (has no administration in time range)  vancomycin variable dose per unstable renal  function (pharmacist dosing) (has no administration in time range)  ceFEPIme (MAXIPIME) 2 g in sodium chloride 0.9 % 100 mL IVPB (has no administration in time range)  enoxaparin (LOVENOX) injection 40 mg (has no administration in time range)  sodium chloride flush (NS) 0.9 % injection 3 mL (has no administration in time range)  0.9 % NaCl with KCl 40 mEq / L  infusion (has no administration in time range)  acetaminophen (TYLENOL) tablet 650 mg (has no administration in time range)    Or  acetaminophen (TYLENOL) suppository 650 mg (has no administration in time range)  ondansetron (ZOFRAN) tablet 4 mg (has no administration in time range)    Or  ondansetron (ZOFRAN) injection 4 mg (has no administration in time range)  senna-docusate (Senokot-S) tablet 1 tablet (has no administration in time range)  diphenhydrAMINE (BENADRYL) capsule 25 mg (has no administration in time range)  DULoxetine (CYMBALTA) DR capsule  60 mg (has no administration in time range)  haloperidol (HALDOL) tablet 10 mg (has no administration in time range)  linaclotide (LINZESS) capsule 145 mcg (has no administration in time range)  simvastatin (ZOCOR) tablet 20 mg (has no administration in time range)  diclofenac Sodium (VOLTAREN) 1 % topical gel 1 application (has no administration in time range)  insulin aspart (novoLOG) injection 0-9 Units (has no administration in time range)  sodium chloride 0.9 % bolus 500 mL (0 mLs Intravenous Stopped 07/29/21 1916)  famotidine (PEPCID) IVPB 20 mg premix (0 mg Intravenous Stopped 07/29/21 1824)  diphenhydrAMINE (BENADRYL) injection 25 mg (25 mg Intravenous Given 07/29/21 1756)  methylPREDNISolone sodium succinate (SOLU-MEDROL) 125 mg/2 mL injection 60 mg (60 mg Intravenous Given 07/29/21 1757)  ondansetron (ZOFRAN) injection 4 mg (4 mg Intravenous Given 07/29/21 1804)  insulin aspart (novoLOG) injection 8 Units (8 Units Intravenous Given 07/29/21 1915)  sodium chloride 0.9 % bolus  500 mL (0 mLs Intravenous Stopped 07/29/21 2123)  acetaminophen (TYLENOL) tablet 1,000 mg (1,000 mg Oral Given 07/29/21 2018)  ceFEPIme (MAXIPIME) 2 g in sodium chloride 0.9 % 100 mL IVPB (0 g Intravenous Stopped 07/29/21 2052)  metroNIDAZOLE (FLAGYL) IVPB 500 mg (0 mg Intravenous Stopped 07/29/21 2338)     Note:  This document was prepared using Dragon voice recognition software and may include unintentional dictation errors.  Nanda Quinton, MD, [facility] Emergency Medicine    Yannis Broce, [facility], MD 07/29/21 [phone]

## 2021-07-29 NOTE — ED Notes (Signed)
Triage RN aware of pts HR and symptoms. Pt taken back to triage.

## 2021-07-30 DIAGNOSIS — N179 Acute kidney failure, unspecified: Secondary | ICD-10-CM

## 2021-07-30 DIAGNOSIS — T781XXA Other adverse food reactions, not elsewhere classified, initial encounter: Secondary | ICD-10-CM | POA: Diagnosis not present

## 2021-07-30 DIAGNOSIS — R6510 Systemic inflammatory response syndrome (SIRS) of non-infectious origin without acute organ dysfunction: Secondary | ICD-10-CM | POA: Diagnosis not present

## 2021-07-30 LAB — BASIC METABOLIC PANEL
Anion gap: 10 (ref 5–15)
BUN: 24 mg/dL — ABNORMAL HIGH (ref 8–23)
CO2: 21 mmol/L — ABNORMAL LOW (ref 22–32)
Calcium: 8.3 mg/dL — ABNORMAL LOW (ref 8.9–10.3)
Chloride: 99 mmol/L (ref 98–111)
Creatinine, Ser: 1.16 mg/dL — ABNORMAL HIGH (ref 0.44–1.00)
GFR, Estimated: 51 mL/min — ABNORMAL LOW (ref >60)
Glucose, Bld: 229 mg/dL — ABNORMAL HIGH (ref 70–99)
Potassium: 4.2 mmol/L (ref 3.5–5.1)
Sodium: 130 mmol/L — ABNORMAL LOW (ref 135–145)

## 2021-07-30 LAB — CBC WITH DIFFERENTIAL/PLATELET
Abs Immature Granulocytes: 0.17 10*3/uL — ABNORMAL HIGH (ref 0.00–0.07)
Basophils Absolute: 0 10*3/uL (ref 0.0–0.1)
Basophils Relative: 0 %
Eosinophils Absolute: 0.1 10*3/uL (ref 0.0–0.5)
Eosinophils Relative: 0 %
HCT: 36 % (ref 36.0–46.0)
Hemoglobin: 11.7 g/dL — ABNORMAL LOW (ref 12.0–15.0)
Immature Granulocytes: 1 %
Lymphocytes Relative: 7 %
Lymphs Abs: 1.2 10*3/uL (ref 0.7–4.0)
MCH: 26.1 pg (ref 26.0–34.0)
MCHC: 32.5 g/dL (ref 30.0–36.0)
MCV: 80.4 fL (ref 80.0–100.0)
Monocytes Absolute: 0.7 10*3/uL (ref 0.1–1.0)
Monocytes Relative: 4 %
Neutro Abs: 15.7 10*3/uL — ABNORMAL HIGH (ref 1.7–7.7)
Neutrophils Relative %: 88 %
Platelets: 299 10*3/uL (ref 150–400)
RBC: 4.48 MIL/uL (ref 3.87–5.11)
RDW: 14 % (ref 11.5–15.5)
WBC: 17.9 10*3/uL — ABNORMAL HIGH (ref 4.0–10.5)
nRBC: 0 % (ref 0.0–0.2)

## 2021-07-30 LAB — TROPONIN I (HIGH SENSITIVITY): Troponin I (High Sensitivity): 16 ng/L (ref <18)

## 2021-07-30 LAB — HIV ANTIBODY (ROUTINE TESTING W REFLEX): HIV Screen 4th Generation wRfx: NONREACTIVE

## 2021-07-30 LAB — LACTIC ACID, PLASMA: Lactic Acid, Venous: 1.5 mmol/L (ref 0.5–1.9)

## 2021-07-30 MED ORDER — LORATADINE 10 MG PO TABS: 10.0000 mg | ORAL_TABLET | Freq: Every day | ORAL | 0 refills | Status: DC

## 2021-07-30 MED ORDER — FAMOTIDINE 20 MG PO TABS: 20.0000 mg | ORAL_TABLET | Freq: Every day | ORAL | 0 refills | Status: DC

## 2021-07-30 MED ORDER — METHYLPREDNISOLONE SODIUM SUCC 125 MG IJ SOLR
60.0000 mg | Freq: Once | INTRAMUSCULAR | Status: AC
  Administered 2021-07-30: 60 mg via INTRAVENOUS
  Filled 2021-07-30: qty 2

## 2021-07-30 MED ORDER — FAMOTIDINE IN NACL 20-0.9 MG/50ML-% IV SOLN
20.0000 mg | Freq: Once | INTRAVENOUS | Status: AC
  Administered 2021-07-30: 20 mg via INTRAVENOUS
  Filled 2021-07-30: qty 50

## 2021-07-30 MED ORDER — PREDNISONE 20 MG PO TABS: 40.0000 mg | ORAL_TABLET | Freq: Every day | ORAL | 0 refills | Status: AC

## 2021-07-30 NOTE — Discharge Summary (Signed)
Physician Discharge Summary  Margaret Bailey MLY:[id number] DOB: May 25, 1951 DOA: 07/29/2021  PCP: Libby Maw, MD  Admit date: 07/29/2021 Discharge date: 07/30/2021  Admitted From: Home Disposition: Home  Recommendations for Outpatient Follow-up:  Follow up with PCP in 1 week  Recommend outpatient evaluation and follow-up by allergy specialist  follow up in ED if symptoms worsen or new appear   Home Health: No Equipment/Devices: None  Discharge Condition: Stable CODE STATUS: Full Diet recommendation: Heart healthy/carb modified  Brief/Interim Summary: 70 y.o. female with medical history significant for type 2 diabetes, hyperlipidemia, mood disorder who presented to the ED for evaluation of pruritus and swelling in her hands after eating lemon pepper 2 days ago.  On presentation, her symptoms improved after receiving IV steroids, Pepcid and Benadryl.  She was observed overnight.  Her condition is improving.  She feels okay to be discharged home today.  She was empirically placed on broad-spectrum antibiotics in the ED but antibiotics have not been continued; no evidence of infection.  She will be discharged home today on oral prednisone, Pepcid and loratadine.  Recommend outpatient evaluation and follow-up by allergy specialist.  Discharge Diagnoses:   Possible allergic reaction to food SIRS -Presented with pruritus and swelling in her hands after eating lemon pepper 2 days ago.  She was tachycardic, tachypneic on presentation with leukocytosis and AKI -Treated with broad-spectrum antibiotics in the ED but antibiotics were not continued.  No evidence of any kind of infection. -Treated with IV steroids, Pepcid and Benadryl.  Her symptoms are improving. -She will be discharged home today on short course of oral prednisone, Pepcid and loratadine.  Recommend outpatient evaluation and follow-up by allergy specialist.  Leukocytosis -Improving  Acute kidney  injury -Creatinine 1.69 on admission.  Treated with IV fluids.  Creatinine improving.  Outpatient follow-up of BMP.  DC ibuprofen for now  Diabetes mellitus type 2 with hyperglycemia -A1c 7.4 on 07/23/2021.  Resume metformin.  Carb modified diet.  Outpatient follow-up with PCP  Hyponatremia -Mild.  Encourage oral intake.  Outpatient follow-up of BMP within a week with PCP  Hyperlipidemia -Continue simvastatin  Mood disorder -Continue home Haldol and Cymbalta.  Outpatient follow-up with psychiatric  Discharge Instructions  Discharge Instructions     Diet - low sodium heart healthy   Complete by: As directed    Diet Carb Modified   Complete by: As directed    Increase activity slowly   Complete by: As directed       Allergies as of 07/30/2021       Reactions   Lemon Oil Itching        Medication List     STOP taking these medications    ibuprofen 200 MG tablet Commonly known as: ADVIL       TAKE these medications    diclofenac Sodium 1 % Gel Commonly known as: VOLTAREN Apply 1 application topically 4 (four) times daily as needed (knees).   diphenhydrAMINE 25 MG tablet Commonly known as: BENADRYL Take 25 mg by mouth every 6 (six) hours as needed for itching.   DULoxetine 60 MG capsule Commonly known as: CYMBALTA Take 60 mg by mouth daily.   famotidine 20 MG tablet Commonly known as: PEPCID Take 1 tablet (20 mg total) by mouth daily for 10 days.   haloperidol 10 MG tablet Commonly known as: HALDOL Take 10 mg by mouth at bedtime.   Linzess 145 MCG Caps capsule Generic drug: linaclotide Take 145 mcg by mouth daily as needed (constipation).  loratadine 10 MG tablet Commonly known as: Claritin Take 1 tablet (10 mg total) by mouth daily for 10 days.   metFORMIN 1000 MG tablet Commonly known as: GLUCOPHAGE Take 1,000 mg by mouth 2 (two) times daily.   predniSONE 20 MG tablet Commonly known as: DELTASONE Take 2 tablets (40 mg total) by mouth  daily with breakfast for 7 days.   simvastatin 20 MG tablet Commonly known as: ZOCOR Take 20 mg by mouth at bedtime.   trolamine salicylate 10 % cream Commonly known as: ASPERCREME Apply 1 application topically as needed (knee pain).        Follow-up Information     Libby Maw, MD. Call in 1 week(s).   Specialty: Family Medicine Contact information: [place] [address] [phone]                Allergies  Allergen Reactions   Lemon Oil Itching    Consultations: None   Procedures/Studies: DG Chest 2 View  Result Date: 07/29/2021 CLINICAL DATA:  Fever. EXAM: CHEST - 2 VIEW COMPARISON:  None. FINDINGS: There is mild eventration of the right hemidiaphragm. No focal consolidation, pleural effusion, pneumothorax. The cardiac silhouette is within normal limits. No acute osseous pathology. IMPRESSION: No active cardiopulmonary disease. Electronically Signed   By: Anner Crete M.D.   On: 07/29/2021 21:23      Subjective: Patient seen and examined at bedside.  She feels better and feels that her swelling and itching are improving wants to go home today.  Denies any overnight fever, nausea or vomiting.  Discharge Exam: Vitals:   07/30/21 0711 07/30/21 0800  BP: 95/60 90/64  Pulse: (!) 111 (!) 102  Resp: (!) 22 18  Temp:    SpO2: 99% 94%    General: Pt is alert, awake, not in acute distress; on room air Cardiovascular: Mild tachycardic; S1/S2 + Respiratory: bilateral decreased breath sounds at bases Abdominal: Soft, NT, ND, bowel sounds + Extremities: Mild lower extremity edema present; no cyanosis    The results of significant diagnostics from this hospitalization (including imaging, microbiology, ancillary and laboratory) are listed below for reference.     Microbiology: Recent Results (from the past 240 hour(s))  Resp Panel by RT-PCR (Flu A&B, Covid) Nasopharyngeal Swab     Status: None   Collection Time:  07/29/21  9:48 PM   Specimen: Nasopharyngeal Swab; Nasopharyngeal(NP) swabs in vial transport medium  Result Value Ref Range Status   SARS Coronavirus 2 by RT PCR NEGATIVE NEGATIVE Final    Comment: (NOTE) SARS-CoV-2 target nucleic acids are NOT DETECTED.  The SARS-CoV-2 RNA is generally detectable in upper respiratory specimens during the acute phase of infection. The lowest concentration of SARS-CoV-2 viral copies this assay can detect is 138 copies/mL. A negative result does not preclude SARS-Cov-2 infection and should not be used as the sole basis for treatment or other patient management decisions. A negative result may occur with  improper specimen collection/handling, submission of specimen other than nasopharyngeal swab, presence of viral mutation(s) within the areas targeted by this assay, and inadequate number of viral copies(<138 copies/mL). A negative result must be combined with clinical observations, patient history, and epidemiological information. The expected result is Negative.  Fact Sheet for Patients:  EntrepreneurPulse.com.au  Fact Sheet for Healthcare Providers:  IncredibleEmployment.be  This test is no t yet approved or cleared by the [place] FDA and  has been authorized for detection and/or diagnosis of SARS-CoV-2 by FDA under an Emergency  Use Authorization (EUA). This EUA will remain  in effect (meaning this test can be used) for the duration of the COVID-19 declaration under Section 564(b)(1) of the Act, 21 U.S.C.section 360bbb-3(b)(1), unless the authorization is terminated  or revoked sooner.       Influenza A by PCR NEGATIVE NEGATIVE Final   Influenza B by PCR NEGATIVE NEGATIVE Final    Comment: (NOTE) The Xpert Xpress SARS-CoV-2/FLU/RSV plus assay is intended as an aid in the diagnosis of influenza from Nasopharyngeal swab specimens and should not be used as a sole basis for treatment. Nasal washings  and aspirates are unacceptable for Xpert Xpress SARS-CoV-2/FLU/RSV testing.  Fact Sheet for Patients: EntrepreneurPulse.com.au  Fact Sheet for Healthcare Providers: IncredibleEmployment.be  This test is not yet approved or cleared by the [place] FDA and has been authorized for detection and/or diagnosis of SARS-CoV-2 by FDA under an Emergency Use Authorization (EUA). This EUA will remain in effect (meaning this test can be used) for the duration of the COVID-19 declaration under Section 564(b)(1) of the Act, 21 U.S.C. section 360bbb-3(b)(1), unless the authorization is terminated or revoked.  Performed at [facility], [place] [address] [place], [place] [address]      Labs: BNP (last 3 results) No results for input(s): BNP in the last 8760 hours. Basic Metabolic Panel: Recent Labs  Lab 07/23/21 1355 07/29/21 1137 07/30/21 0500  NA 141 130* 130*  K 4.4 4.7 4.2  CL 103 98 99  CO2 32 19* 21*  GLUCOSE 101* 298* 229*  BUN 13 21 24*  CREATININE 0.73 1.69* 1.16*  CALCIUM 10.1 9.2 8.3*   Liver Function Tests: Recent Labs  Lab 07/23/21 1355 07/29/21 1742  AST 15 23  ALT 15 15  ALKPHOS 82 77  BILITOT 0.4 0.8  PROT 7.5 7.5  ALBUMIN 4.6 3.8   Recent Labs  Lab 07/29/21 1742  LIPASE 25   No results for input(s): AMMONIA in the last 168 hours. CBC: Recent Labs  Lab 07/23/21 1355 07/29/21 1137 07/30/21 0500  WBC 7.8 22.1* 17.9*  NEUTROABS  --   --  15.7*  HGB 12.2 14.7 11.7*  HCT 39.0 45.2 36.0  MCV 80.6 79.3* 80.4  PLT 281.0 361 299   Cardiac Enzymes: No results for input(s): CKTOTAL, CKMB, CKMBINDEX, TROPONINI in the last 168 hours. BNP: Invalid input(s): POCBNP CBG: Recent Labs  Lab 07/29/21 2142  GLUCAP 169*   D-Dimer No results for input(s): DDIMER in the last 72 hours. Hgb A1c No results for input(s): HGBA1C in the last 72 hours. Lipid Profile No results for input(s): CHOL, HDL, LDLCALC, TRIG,  CHOLHDL, LDLDIRECT in the last 72 hours. Thyroid function studies No results for input(s): TSH, T4TOTAL, T3FREE, THYROIDAB in the last 72 hours.  Invalid input(s): FREET3 Anemia work up No results for input(s): VITAMINB12, FOLATE, FERRITIN, TIBC, IRON, RETICCTPCT in the last 72 hours. Urinalysis    Component Value Date/Time   COLORURINE YELLOW 07/29/2021 2318   APPEARANCEUR CLEAR 07/29/2021 2318   LABSPEC >1.030 (H) 07/29/2021 2318   PHURINE 5.5 07/29/2021 2318   GLUCOSEU NEGATIVE 07/29/2021 2318   GLUCOSEU NEGATIVE 07/23/2021 1355   [place] 07/29/2021 2318   [place] 07/29/2021 2318   [place] 07/29/2021 2318   PROTEINUR NEGATIVE 07/29/2021 2318   UROBILINOGEN 0.2 07/23/2021 1355   NITRITE NEGATIVE 07/29/2021 2318   LEUKOCYTESUR NEGATIVE 07/29/2021 2318   Sepsis Labs Invalid input(s): PROCALCITONIN,  WBC,  LACTICIDVEN Microbiology Recent Results (from the past 240  hour(s))  Resp Panel by RT-PCR (Flu A&B, Covid) Nasopharyngeal Swab     Status: None   Collection Time: 07/29/21  9:48 PM   Specimen: Nasopharyngeal Swab; Nasopharyngeal(NP) swabs in vial transport medium  Result Value Ref Range Status   SARS Coronavirus 2 by RT PCR NEGATIVE NEGATIVE Final    Comment: (NOTE) SARS-CoV-2 target nucleic acids are NOT DETECTED.  The SARS-CoV-2 RNA is generally detectable in upper respiratory specimens during the acute phase of infection. The lowest concentration of SARS-CoV-2 viral copies this assay can detect is 138 copies/mL. A negative result does not preclude SARS-Cov-2 infection and should not be used as the sole basis for treatment or other patient management decisions. A negative result may occur with  improper specimen collection/handling, submission of specimen other than nasopharyngeal swab, presence of viral mutation(s) within the areas targeted by this assay, and inadequate number of viral copies(<138 copies/mL). A negative result must be  combined with clinical observations, patient history, and epidemiological information. The expected result is Negative.  Fact Sheet for Patients:  EntrepreneurPulse.com.au  Fact Sheet for Healthcare Providers:  IncredibleEmployment.be  This test is no t yet approved or cleared by the [place] FDA and  has been authorized for detection and/or diagnosis of SARS-CoV-2 by FDA under an Emergency Use Authorization (EUA). This EUA will remain  in effect (meaning this test can be used) for the duration of the COVID-19 declaration under Section 564(b)(1) of the Act, 21 U.S.C.section 360bbb-3(b)(1), unless the authorization is terminated  or revoked sooner.       Influenza A by PCR NEGATIVE NEGATIVE Final   Influenza B by PCR NEGATIVE NEGATIVE Final    Comment: (NOTE) The Xpert Xpress SARS-CoV-2/FLU/RSV plus assay is intended as an aid in the diagnosis of influenza from Nasopharyngeal swab specimens and should not be used as a sole basis for treatment. Nasal washings and aspirates are unacceptable for Xpert Xpress SARS-CoV-2/FLU/RSV testing.  Fact Sheet for Patients: EntrepreneurPulse.com.au  Fact Sheet for Healthcare Providers: IncredibleEmployment.be  This test is not yet approved or cleared by the [place] FDA and has been authorized for detection and/or diagnosis of SARS-CoV-2 by FDA under an Emergency Use Authorization (EUA). This EUA will remain in effect (meaning this test can be used) for the duration of the COVID-19 declaration under Section 564(b)(1) of the Act, 21 U.S.C. section 360bbb-3(b)(1), unless the authorization is terminated or revoked.  Performed at [facility], [place] [address] [place], [place] [address]      Time coordinating discharge: 35 minutes  SIGNED:   Aline August, MD  [facility] 07/30/2021, 9:07 AM

## 2021-07-30 NOTE — ED Notes (Signed)
Pt ambulatory to restroom with 1 assist. Pt labored when walking back to room.

## 2021-07-30 NOTE — ED Notes (Signed)
Breakfast order placed ?

## 2021-07-30 NOTE — ED Notes (Signed)
Pt verbalized understanding of d/c instructions, meds and followup care. Denies questions. VSS, no distress noted. Steady gait to exit with all belongings.  ?

## 2021-07-31 LAB — URINE CULTURE: Culture: NO GROWTH

## 2021-08-02 ENCOUNTER — Inpatient Hospital Stay (HOSPITAL_BASED_OUTPATIENT_CLINIC_OR_DEPARTMENT_OTHER): Admission: RE | Admit: 2021-08-02 | Payer: Medicare Other | Source: Ambulatory Visit

## 2021-08-04 ENCOUNTER — Ambulatory Visit: Payer: Medicare Other | Admitting: Podiatry

## 2021-08-04 ENCOUNTER — Telehealth: Payer: Self-pay | Admitting: *Deleted

## 2021-08-04 LAB — CULTURE, BLOOD (ROUTINE X 2)
Culture: NO GROWTH
Culture: NO GROWTH
Special Requests: ADEQUATE
Special Requests: ADEQUATE

## 2021-08-04 NOTE — Telephone Encounter (Signed)
Patient is calling to change date for upcoming appointment. Please call. Patient has been rescheduled.

## 2021-08-09 ENCOUNTER — Ambulatory Visit (HOSPITAL_BASED_OUTPATIENT_CLINIC_OR_DEPARTMENT_OTHER)
Admission: RE | Admit: 2021-08-09 | Discharge: 2021-08-09 | Disposition: A | Discharge status: Home / Self Care | Payer: Medicare Other | Source: Ambulatory Visit | Attending: Family Medicine | Admitting: Family Medicine

## 2021-08-09 ENCOUNTER — Other Ambulatory Visit: Payer: Self-pay

## 2021-08-09 DIAGNOSIS — R9089 Other abnormal findings on diagnostic imaging of central nervous system: Secondary | ICD-10-CM | POA: Diagnosis not present

## 2021-08-09 MED ORDER — GADOBUTROL 1 MMOL/ML IV SOLN
8.4000 mL | Freq: Once | INTRAVENOUS | Status: AC | PRN
  Administered 2021-08-09: 8.4 mL via INTRAVENOUS

## 2021-08-11 NOTE — Addendum Note (Signed)
Addended by: Jon Billings on: 08/11/2021 04:17 PM   Modules accepted: Orders

## 2021-08-18 ENCOUNTER — Telehealth: Payer: Self-pay | Admitting: Family Medicine

## 2021-08-18 DIAGNOSIS — E7800 Pure hypercholesterolemia, unspecified: Secondary | ICD-10-CM

## 2021-08-18 DIAGNOSIS — K219 Gastro-esophageal reflux disease without esophagitis: Secondary | ICD-10-CM

## 2021-08-18 DIAGNOSIS — E1165 Type 2 diabetes mellitus with hyperglycemia: Secondary | ICD-10-CM

## 2021-08-18 MED ORDER — FAMOTIDINE 20 MG PO TABS: 20.0000 mg | ORAL_TABLET | Freq: Every day | ORAL | 0 refills | Status: DC

## 2021-08-18 MED ORDER — METFORMIN HCL 1000 MG PO TABS: 1000.0000 mg | ORAL_TABLET | Freq: Two times a day (BID) | ORAL | 1 refills | Status: DC

## 2021-08-18 MED ORDER — SIMVASTATIN 20 MG PO TABS: 20.0000 mg | ORAL_TABLET | Freq: Every day | ORAL | 1 refills | Status: DC

## 2021-08-18 NOTE — Telephone Encounter (Signed)
Refills sent in

## 2021-09-03 ENCOUNTER — Ambulatory Visit: Payer: Medicare Other | Admitting: Podiatry

## 2021-09-04 ENCOUNTER — Encounter: Payer: Self-pay | Admitting: Family Medicine

## 2021-09-04 ENCOUNTER — Telehealth (INDEPENDENT_AMBULATORY_CARE_PROVIDER_SITE_OTHER): Payer: Commercial Managed Care - HMO | Admitting: Family Medicine

## 2021-09-04 ENCOUNTER — Other Ambulatory Visit: Payer: Self-pay

## 2021-09-04 VITALS — Ht 61.0 in

## 2021-09-04 DIAGNOSIS — D352 Benign neoplasm of pituitary gland: Secondary | ICD-10-CM | POA: Diagnosis not present

## 2021-09-04 NOTE — Progress Notes (Addendum)
Established Patient Office Visit  Subjective:  Patient ID: Margaret Bailey, female    DOB: [birth year]  Age: 71 y.o. MRN: [id number]  CC:  Chief Complaint  Patient presents with   Advice Only    Discuss xray results.     HPI Margaret Bailey presents for follow-up of the MRI of her brain.  This is been ordered because chart review at her initial visit showed an abnormality in the sella area on an MRI from 2018.  She denies headaches but she does feel slight tingling in the right upper parietal area.  Denies visual changes or diplopia.  A follow-up MRI was recommended with a detailed view of the sella.  This was ordered a month ago but has not been scheduled.  Past Medical History:  Diagnosis Date   Diabetes mellitus without complication ([place])    Hearing loss     Past Surgical History:  Procedure Laterality Date   CESAREAN SECTION      History reviewed. No pertinent family history.  Social History   Socioeconomic History   Marital status: Single    Spouse name: Not on file   Number of children: Not on file   Years of education: Not on file   Highest education level: Not on file  Occupational History   Not on file  Tobacco Use   Smoking status: Former    Types: Cigarettes    Quit date: 08/31/1998    Years since quitting: 23.1   Smokeless tobacco: Never  Vaping Use   Vaping Use: Never used  Substance and Sexual Activity   Alcohol use: Yes    Alcohol/week: 1.0 standard drink    Types: 1 Glasses of wine per week    Comment: rare once a year   Drug use: Never   Sexual activity: Not Currently  Other Topics Concern   Not on file  Social History Narrative   Not on file   Social Determinants of Health   Financial Resource Strain: Not on file  Food Insecurity: Not on file  Transportation Needs: Not on file  Physical Activity: Not on file  Stress: Not on file  Social Connections: Not on file  Intimate Partner Violence: Not on file    Outpatient Medications  Prior to Visit  Medication Sig Dispense Refill   BAYER ASPIRIN PO Take 81 mg by mouth.     diclofenac Sodium (VOLTAREN) 1 % GEL Apply 1 application topically 4 (four) times daily as needed (knees).     diphenhydrAMINE (BENADRYL) 25 MG tablet Take 25 mg by mouth every 6 (six) hours as needed for itching.     DULoxetine (CYMBALTA) 60 MG capsule Take 60 mg by mouth daily.     famotidine (PEPCID) 20 MG tablet Take 1 tablet (20 mg total) by mouth daily for 10 days. 10 tablet 0   haloperidol (HALDOL) 10 MG tablet Take 10 mg by mouth at bedtime.     LINZESS 145 MCG CAPS capsule Take 145 mcg by mouth daily as needed (constipation).     metFORMIN (GLUCOPHAGE) 1000 MG tablet Take 1 tablet (1,000 mg total) by mouth 2 (two) times daily. 180 tablet 1   simvastatin (ZOCOR) 20 MG tablet Take 1 tablet (20 mg total) by mouth at bedtime. 90 tablet 1   trolamine salicylate (ASPERCREME) 10 % cream Apply 1 application topically as needed (knee pain).     loratadine (CLARITIN) 10 MG tablet Take 1 tablet (10 mg total) by mouth daily for 10 days. (  Patient not taking: Reported on 09/04/2021) 10 tablet 0   No facility-administered medications prior to visit.    Allergies  Allergen Reactions   Lemon Oil Itching    ROS Review of Systems  Constitutional:  Negative for diaphoresis, fatigue, fever and unexpected weight change.  HENT: Negative.    Eyes:  Negative for photophobia and visual disturbance.  Respiratory: Negative.    Cardiovascular: Negative.   Gastrointestinal: Negative.   Genitourinary: Negative.   Musculoskeletal:  Negative for gait problem and joint swelling.  Neurological:  Negative for dizziness, speech difficulty, weakness, light-headedness, numbness and headaches.     Objective:    Physical Exam Vitals and nursing note reviewed.  Constitutional:      General: She is not in acute distress.    Appearance: Normal appearance. She is not ill-appearing, toxic-appearing or diaphoretic.  HENT:      Head: Normocephalic and atraumatic.  Eyes:     General: No scleral icterus.       Right eye: No discharge.        Left eye: No discharge.     Extraocular Movements: Extraocular movements intact.     Conjunctiva/sclera: Conjunctivae normal.  Pulmonary:     Effort: Pulmonary effort is normal.  Neurological:     Mental Status: She is alert and oriented to person, place, and time.  Psychiatric:        Mood and Affect: Mood normal.        Behavior: Behavior normal.    Ht 5\' 1"  (1.549 m)    BMI 35.33 kg/m  Wt Readings from Last 3 Encounters:  07/30/21 187 lb (84.8 kg)  07/23/21 185 lb 3.2 oz (84 kg)     Health Maintenance Due  Topic Date Due   OPHTHALMOLOGY EXAM  Never done   Hepatitis C Screening  Never done   TETANUS/TDAP  Never done   COLONOSCOPY (Pts 45-97yrs Insurance coverage will need to be confirmed)  Never done   MAMMOGRAM  Never done   Zoster Vaccines- Shingrix (1 of 2) Never done   DEXA SCAN  Never done    There are no preventive care reminders to display for this patient.  No results found for: TSH Lab Results  Component Value Date   WBC 17.9 (H) 07/30/2021   HGB 11.7 (L) 07/30/2021   HCT 36.0 07/30/2021   MCV 80.4 07/30/2021   PLT 299 07/30/2021   Lab Results  Component Value Date   NA 130 (L) 07/30/2021   K 4.2 07/30/2021   CO2 21 (L) 07/30/2021   GLUCOSE 229 (H) 07/30/2021   BUN 24 (H) 07/30/2021   CREATININE 1.16 (H) 07/30/2021   BILITOT 0.8 07/29/2021   ALKPHOS 77 07/29/2021   AST 23 07/29/2021   ALT 15 07/29/2021   PROT 7.5 07/29/2021   ALBUMIN 3.8 07/29/2021   CALCIUM 8.3 (L) 07/30/2021   ANIONGAP 10 07/30/2021   GFR 84.15 07/23/2021   Lab Results  Component Value Date   CHOL 155 07/23/2021   Lab Results  Component Value Date   HDL 54.40 07/23/2021   Lab Results  Component Value Date   LDLCALC 78 07/23/2021   Lab Results  Component Value Date   TRIG 115.0 07/23/2021   Lab Results  Component Value Date   CHOLHDL 3  07/23/2021   Lab Results  Component Value Date   HGBA1C 7.4 (H) 07/23/2021      Assessment & Plan:   Problem List Items Addressed This Visit  None Visit Diagnoses     Pituitary macroadenoma ([place])    -  Primary   Relevant Orders   MR Brain W Wo Contrast (Completed)   Ambulatory referral to Neurosurgery       No orders of the defined types were placed in this encounter.   Follow-up: Return Follow-up is scheduled with me next month..  Have reordered MRI with dedicated sella view.  She has follow-up scheduled with me next month.  We will be checking pituitary function.  Libby Maw, MD  Virtual Visit via Video Note  I connected with Margaret Bailey on 10/15/21 at 11:30 AM EST by a video enabled telemedicine application and verified that I am speaking with the correct person using two identifiers.  Location: Patient: home alone. Her daughter is at work. Provider: work   I discussed the limitations of evaluation and management by telemedicine and the availability of in person appointments. The patient expressed understanding and agreed to proceed.  History of Present Illness:    Observations/Objective:   Assessment and Plan:   Follow Up Instructions:    I discussed the assessment and treatment plan with the patient. The patient was provided an opportunity to ask questions and all were answered. The patient agreed with the plan and demonstrated an understanding of the instructions.   The patient was advised to call back or seek an in-person evaluation if the symptoms worsen or if the condition fails to improve as anticipated.  I provided 30 minutes of non-face-to-face time during this encounter.   Libby Maw, MD

## 2021-09-24 ENCOUNTER — Other Ambulatory Visit: Payer: Self-pay

## 2021-09-24 ENCOUNTER — Encounter: Payer: Self-pay | Admitting: Podiatry

## 2021-09-24 ENCOUNTER — Ambulatory Visit (INDEPENDENT_AMBULATORY_CARE_PROVIDER_SITE_OTHER): Payer: Medicare Other | Admitting: Podiatry

## 2021-09-24 DIAGNOSIS — M79675 Pain in left toe(s): Secondary | ICD-10-CM | POA: Insufficient documentation

## 2021-09-24 DIAGNOSIS — M79674 Pain in right toe(s): Secondary | ICD-10-CM

## 2021-09-24 DIAGNOSIS — B351 Tinea unguium: Secondary | ICD-10-CM

## 2021-09-24 DIAGNOSIS — E1165 Type 2 diabetes mellitus with hyperglycemia: Secondary | ICD-10-CM

## 2021-09-24 DIAGNOSIS — R69 Illness, unspecified: Secondary | ICD-10-CM | POA: Diagnosis not present

## 2021-09-24 NOTE — Progress Notes (Signed)
This patient presents to the office with chief complaint of long thick painful nails.  Patient says the nails are painful walking and wearing shoes.  This patient is unable to self treat.  This patient is unable to trim her nails since she is unable to reach her nails.  She presents to the office for preventative foot care services.  General Appearance  Alert, conversant and in no acute stress.  Vascular  Dorsalis pedis and posterior tibial  pulses are palpable  bilaterally.  Capillary return is within normal limits  bilaterally. Temperature is within normal limits  bilaterally.  Neurologic  Senn-Weinstein monofilament wire test within normal limits  bilaterally. Muscle power within normal limits bilaterally.  Nails Thick disfigured discolored nails with subungual debris  hallux nails  bilaterally. No evidence of bacterial infection or drainage bilaterally.  Orthopedic  No limitations of motion  feet .  No crepitus or effusions noted.  No bony pathology or digital deformities noted.  Skin  normotropic skin with no porokeratosis noted bilaterally.  No signs of infections or ulcers noted.     Onychomycosis  Nails  B/L.  Pain in right toes  Pain in left toes  Debridement of nails both feet followed trimming the nails with dremel tool.  Patient has gel polish on her nails so nails were only debrided.  RTC 3 months.   Gardiner Barefoot DPM

## 2021-09-25 ENCOUNTER — Other Ambulatory Visit: Payer: Commercial Managed Care - HMO

## 2021-10-01 ENCOUNTER — Other Ambulatory Visit: Payer: Commercial Managed Care - HMO

## 2021-10-09 DIAGNOSIS — T162XXA Foreign body in left ear, initial encounter: Secondary | ICD-10-CM | POA: Diagnosis not present

## 2021-10-09 DIAGNOSIS — H903 Sensorineural hearing loss, bilateral: Secondary | ICD-10-CM | POA: Diagnosis not present

## 2021-10-14 ENCOUNTER — Ambulatory Visit
Admission: RE | Admit: 2021-10-14 | Discharge: 2021-10-14 | Disposition: A | Discharge status: Home / Self Care | Payer: Medicare Other | Source: Ambulatory Visit | Attending: Family Medicine | Admitting: Family Medicine

## 2021-10-14 DIAGNOSIS — D352 Benign neoplasm of pituitary gland: Secondary | ICD-10-CM

## 2021-10-14 DIAGNOSIS — E237 Disorder of pituitary gland, unspecified: Secondary | ICD-10-CM | POA: Diagnosis not present

## 2021-10-14 DIAGNOSIS — R220 Localized swelling, mass and lump, head: Secondary | ICD-10-CM | POA: Diagnosis not present

## 2021-10-14 DIAGNOSIS — C719 Malignant neoplasm of brain, unspecified: Secondary | ICD-10-CM | POA: Diagnosis not present

## 2021-10-14 MED ORDER — GADOBENATE DIMEGLUMINE 529 MG/ML IV SOLN
9.0000 mL | Freq: Once | INTRAVENOUS | Status: AC | PRN
  Administered 2021-10-14: 9 mL via INTRAVENOUS

## 2021-10-15 NOTE — Addendum Note (Signed)
Addended by: Jon Billings on: 10/15/2021 09:16 AM   Modules accepted: Orders

## 2021-10-20 ENCOUNTER — Other Ambulatory Visit: Payer: Self-pay

## 2021-10-20 ENCOUNTER — Ambulatory Visit (INDEPENDENT_AMBULATORY_CARE_PROVIDER_SITE_OTHER): Payer: Medicare Other | Admitting: Family Medicine

## 2021-10-20 ENCOUNTER — Encounter: Payer: Self-pay | Admitting: Family Medicine

## 2021-10-20 VITALS — BP 134/68 | HR 82 | Temp 97.4°F | Ht 61.0 in | Wt 186.2 lb

## 2021-10-20 DIAGNOSIS — E7800 Pure hypercholesterolemia, unspecified: Secondary | ICD-10-CM

## 2021-10-20 DIAGNOSIS — E1165 Type 2 diabetes mellitus with hyperglycemia: Secondary | ICD-10-CM

## 2021-10-20 DIAGNOSIS — D352 Benign neoplasm of pituitary gland: Secondary | ICD-10-CM

## 2021-10-20 NOTE — Progress Notes (Signed)
Established Patient Office Visit  Subjective:  Patient ID: Margaret Bailey, female    DOB: 07-11-1951  Age: 71 y.o. MRN: [id number]  CC:  Chief Complaint  Patient presents with   Follow-up    3 month follow up, no concerns. Referral to eye doctor. Fasting    HPI Margaret Bailey presents for follow-up of pituitary adenoma, diabetes and elevated cholesterol.  Diabetes has been well controlled with the thousand metformin being daily.  Lipids are controlled with simvastatin.  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  Patient has been referred to neurosurgery  Past Medical History:  Diagnosis Date   Diabetes mellitus without complication ([place])    Hearing loss     Past Surgical History:  Procedure Laterality Date   CESAREAN SECTION      History reviewed. No pertinent family history.  Social History   Socioeconomic History   Marital status: Single    Spouse name: Not on file   Number of children: Not on file   Years of education: Not on file   Highest education level: Not on file  Occupational History   Not on file  Tobacco Use   Smoking status: Former    Types: Cigarettes    Quit date: 08/31/1998    Years since quitting: 23.1   Smokeless tobacco: Never  Vaping Use   Vaping Use: Never used  Substance and Sexual Activity   Alcohol use: Yes    Alcohol/week: 1.0 standard drink    Types: 1 Glasses of wine per week    Comment: rare once a year   Drug use: Never   Sexual activity: Not Currently  Other Topics Concern   Not on file  Social History Narrative   Not on file   Social Determinants of Health   Financial Resource Strain: Not on file  Food Insecurity: Not on file  Transportation Needs: Not on file  Physical Activity: Not on file  Stress: Not on file  Social Connections: Not on file  Intimate Partner Violence: Not on file    Outpatient Medications Prior to Visit  Medication Sig Dispense Refill    BAYER ASPIRIN PO Take 81 mg by mouth.     diclofenac Sodium (VOLTAREN) 1 % GEL Apply 1 application topically 4 (four) times daily as needed (knees).     diphenhydrAMINE (BENADRYL) 25 MG tablet Take 25 mg by mouth every 6 (six) hours as needed for itching.     DULoxetine (CYMBALTA) 60 MG capsule Take 60 mg by mouth daily.     haloperidol (HALDOL) 10 MG tablet Take 10 mg by mouth at bedtime.     LINZESS 145 MCG CAPS capsule Take 145 mcg by mouth daily as needed (constipation).     metFORMIN (GLUCOPHAGE) 1000 MG tablet Take 1 tablet (1,000 mg total) by mouth 2 (two) times daily. 180 tablet 1   simvastatin (ZOCOR) 20 MG tablet Take 1 tablet (20 mg total) by mouth at bedtime. 90 tablet 1   trolamine salicylate (ASPERCREME) 10 % cream Apply 1 application topically as needed (knee pain).     famotidine (PEPCID) 20 MG tablet Take 1 tablet (20 mg total) by mouth daily for 10 days. 10 tablet 0   loratadine (CLARITIN) 10 MG tablet Take 1 tablet (10 mg total) by mouth daily for 10 days. (Patient not taking: Reported on 09/04/2021) 10 tablet 0   No facility-administered medications prior to visit.    Allergies  Allergen  Reactions   Lemon Oil Itching    ROS Review of Systems  Constitutional:  Negative for chills, diaphoresis, fatigue, fever and unexpected weight change.  HENT: Negative.    Eyes:  Negative for photophobia and visual disturbance.  Respiratory: Negative.    Cardiovascular: Negative.   Gastrointestinal: Negative.   Endocrine: Negative for polyphagia and polyuria.  Genitourinary: Negative.   Musculoskeletal:  Negative for gait problem and joint swelling.  Neurological:  Negative for speech difficulty, weakness, light-headedness and headaches.     Objective:    Physical Exam Vitals and nursing note reviewed.  Constitutional:      General: She is not in acute distress.    Appearance: Normal appearance. She is not ill-appearing, toxic-appearing or diaphoretic.  HENT:     Head:  Normocephalic and atraumatic.     Right Ear: External ear normal.     Left Ear: External ear normal.     Mouth/Throat:     Mouth: Mucous membranes are moist.     Pharynx: Oropharynx is clear. No oropharyngeal exudate or posterior oropharyngeal erythema.  Eyes:     Extraocular Movements: Extraocular movements intact.     Conjunctiva/sclera: Conjunctivae normal.     Pupils: Pupils are equal, round, and reactive to light.  Cardiovascular:     Rate and Rhythm: Normal rate and regular rhythm.  Pulmonary:     Effort: Pulmonary effort is normal.     Breath sounds: Normal breath sounds.  Abdominal:     General: Bowel sounds are normal.  Musculoskeletal:     Cervical back: No rigidity or tenderness.  Lymphadenopathy:     Cervical: No cervical adenopathy.  Skin:    General: Skin is warm and dry.  Neurological:     Mental Status: She is alert and oriented to person, place, and time.     Cranial Nerves: No cranial nerve deficit.  Psychiatric:        Mood and Affect: Mood normal.        Behavior: Behavior normal.    BP 134/68 (BP Location: Right Arm, Patient Position: Sitting, Cuff Size: Large)    Pulse 82    Temp (!) 97.4 F (36.3 C) (Temporal)    Ht 5\' 1"  (1.549 m)    Wt 186 lb 3.2 oz (84.5 kg)    SpO2 95%    BMI 35.18 kg/m  Wt Readings from Last 3 Encounters:  10/20/21 186 lb 3.2 oz (84.5 kg)  07/30/21 187 lb (84.8 kg)  07/23/21 185 lb 3.2 oz (84 kg)     Health Maintenance Due  Topic Date Due   OPHTHALMOLOGY EXAM  Never done   Hepatitis C Screening  Never done   DEXA SCAN  Never done    There are no preventive care reminders to display for this patient.  No results found for: TSH Lab Results  Component Value Date   WBC 17.9 (H) 07/30/2021   HGB 11.7 (L) 07/30/2021   HCT 36.0 07/30/2021   MCV 80.4 07/30/2021   PLT 299 07/30/2021   Lab Results  Component Value Date   NA 130 (L) 07/30/2021   K 4.2 07/30/2021   CO2 21 (L) 07/30/2021   GLUCOSE 229 (H) 07/30/2021    BUN 24 (H) 07/30/2021   CREATININE 1.16 (H) 07/30/2021   BILITOT 0.8 07/29/2021   ALKPHOS 77 07/29/2021   AST 23 07/29/2021   ALT 15 07/29/2021   PROT 7.5 07/29/2021   ALBUMIN 3.8 07/29/2021   CALCIUM 8.3 (L) 07/30/2021  ANIONGAP 10 07/30/2021   GFR 84.15 07/23/2021   Lab Results  Component Value Date   CHOL 155 07/23/2021   Lab Results  Component Value Date   HDL 54.40 07/23/2021   Lab Results  Component Value Date   LDLCALC 78 07/23/2021   Lab Results  Component Value Date   TRIG 115.0 07/23/2021   Lab Results  Component Value Date   CHOLHDL 3 07/23/2021   Lab Results  Component Value Date   HGBA1C 7.4 (H) 07/23/2021      Assessment & Plan:   Problem List Items Addressed This Visit       Endocrine   Type 2 diabetes mellitus with hyperglycemia ([place])   Relevant Orders   Ambulatory referral to Ophthalmology   Other Visit Diagnoses     Pituitary macroadenoma ([place])    -  Primary   Relevant Orders   Ambulatory referral to Neurosurgery   Elevated cholesterol           No orders of the defined types were placed in this encounter.   Follow-up: Return in about 3 months (around 01/17/2022).  Advised that there may be medical treatment for her pituitary adenoma not involving surgery.  She will need to discuss this with neurosurgeon.  Continue metformin and simvastatin.  Follow-up in 3 months.  Libby Maw, MD

## 2021-10-22 DIAGNOSIS — H903 Sensorineural hearing loss, bilateral: Secondary | ICD-10-CM | POA: Diagnosis not present

## 2021-10-22 DIAGNOSIS — H938X3 Other specified disorders of ear, bilateral: Secondary | ICD-10-CM | POA: Diagnosis not present

## 2021-10-22 DIAGNOSIS — Z87891 Personal history of nicotine dependence: Secondary | ICD-10-CM | POA: Diagnosis not present

## 2021-10-24 ENCOUNTER — Ambulatory Visit: Payer: Medicare Other | Admitting: Nurse Practitioner

## 2021-11-26 ENCOUNTER — Ambulatory Visit (INDEPENDENT_AMBULATORY_CARE_PROVIDER_SITE_OTHER): Payer: Medicare Other

## 2021-11-26 DIAGNOSIS — Z0000 Encounter for general adult medical examination without abnormal findings: Secondary | ICD-10-CM

## 2021-11-26 NOTE — Patient Instructions (Signed)
Margaret Bailey , ?Thank you for taking time to come for your Medicare Wellness Visit. I appreciate your ongoing commitment to your health goals. Please review the following plan we discussed and let me know if I can assist you in the future.  ? ?Screening recommendations/referrals: ?Colonoscopy: 09/10/2020  due 2032 ?Mammogram: declined  ?Bone Density: declined  ?Recommended yearly ophthalmology/optometry visit for glaucoma screening and checkup ?Recommended yearly dental visit for hygiene and checkup ? ?Vaccinations: ?Influenza vaccine: completed  ?Pneumococcal vaccine: declined  ?Tdap vaccine: due  ?Shingles vaccine: will consider    ? ?Advanced directives: yes  ? ?Conditions/risks identified: none  ? ?Next appointment: none  ? ? ?Preventive Care 75 Years and Older, Female ?Preventive care refers to lifestyle choices and visits with your health care provider that can promote health and wellness. ?What does preventive care include? ?A yearly physical exam. This is also called an annual well check. ?Dental exams once or twice a year. ?Routine eye exams. Ask your health care provider how often you should have your eyes checked. ?Personal lifestyle choices, including: ?Daily care of your teeth and gums. ?Regular physical activity. ?Eating a healthy diet. ?Avoiding tobacco and drug use. ?Limiting alcohol use. ?Practicing safe sex. ?Taking low-dose aspirin every day. ?Taking vitamin and mineral supplements as recommended by your health care provider. ?What happens during an annual well check? ?The services and screenings done by your health care provider during your annual well check will depend on your age, overall health, lifestyle risk factors, and family history of disease. ?Counseling  ?Your health care provider may ask you questions about your: ?Alcohol use. ?Tobacco use. ?Drug use. ?Emotional well-being. ?Home and relationship well-being. ?Sexual activity. ?Eating habits. ?History of falls. ?Memory and ability to  understand (cognition). ?Work and work Statistician. ?Reproductive health. ?Screening  ?You may have the following tests or measurements: ?Height, weight, and BMI. ?Blood pressure. ?Lipid and cholesterol levels. These may be checked every 5 years, or more frequently if you are over 68 years old. ?Skin check. ?Lung cancer screening. You may have this screening every year starting at age 38 if you have a 30-pack-year history of smoking and currently smoke or have quit within the past 15 years. ?Fecal occult blood test (FOBT) of the stool. You may have this test every year starting at age 57. ?Flexible sigmoidoscopy or colonoscopy. You may have a sigmoidoscopy every 5 years or a colonoscopy every 10 years starting at age 51. ?Hepatitis C blood test. ?Hepatitis B blood test. ?Sexually transmitted disease (STD) testing. ?Diabetes screening. This is done by checking your blood sugar (glucose) after you have not eaten for a while (fasting). You may have this done every 1-3 years. ?Bone density scan. This is done to screen for osteoporosis. You may have this done starting at age 103. ?Mammogram. This may be done every 1-2 years. Talk to your health care provider about how often you should have regular mammograms. ?Talk with your health care provider about your test results, treatment options, and if necessary, the need for more tests. ?Vaccines  ?Your health care provider may recommend certain vaccines, such as: ?Influenza vaccine. This is recommended every year. ?Tetanus, diphtheria, and acellular pertussis (Tdap, Td) vaccine. You may need a Td booster every 10 years. ?Zoster vaccine. You may need this after age 57. ?Pneumococcal 13-valent conjugate (PCV13) vaccine. One dose is recommended after age 63. ?Pneumococcal polysaccharide (PPSV23) vaccine. One dose is recommended after age 45. ?Talk to your health care provider about which screenings and  vaccines you need and how often you need them. ?This information is not  intended to replace advice given to you by your health care provider. Make sure you discuss any questions you have with your health care provider. ?Document Released: 09/13/2015 Document Revised: 05/06/2016 Document Reviewed: 06/18/2015 ?Elsevier Interactive Patient Education ? 2017 [place]. ? ?Fall Prevention in the Home ?Falls can cause injuries. They can happen to people of all ages. There are many things you can do to make your home safe and to help prevent falls. ?What can I do on the outside of my home? ?Regularly fix the edges of walkways and driveways and fix any cracks. ?Remove anything that might make you trip as you walk through a door, such as a raised step or threshold. ?Trim any bushes or trees on the path to your home. ?Use bright outdoor lighting. ?Clear any walking paths of anything that might make someone trip, such as rocks or tools. ?Regularly check to see if handrails are loose or broken. Make sure that both sides of any steps have handrails. ?Any raised decks and porches should have guardrails on the edges. ?Have any leaves, snow, or ice cleared regularly. ?Use sand or salt on walking paths during winter. ?Clean up any spills in your garage right away. This includes oil or grease spills. ?What can I do in the bathroom? ?Use night lights. ?Install grab bars by the toilet and in the tub and shower. Do not use towel bars as grab bars. ?Use non-skid mats or decals in the tub or shower. ?If you need to sit down in the shower, use a plastic, non-slip stool. ?Keep the floor dry. Clean up any water that spills on the floor as soon as it happens. ?Remove soap buildup in the tub or shower regularly. ?Attach bath mats securely with double-sided non-slip rug tape. ?Do not have throw rugs and other things on the floor that can make you trip. ?What can I do in the bedroom? ?Use night lights. ?Make sure that you have a light by your bed that is easy to reach. ?Do not use any sheets or blankets that are  too big for your bed. They should not hang down onto the floor. ?Have a firm chair that has side arms. You can use this for support while you get dressed. ?Do not have throw rugs and other things on the floor that can make you trip. ?What can I do in the kitchen? ?Clean up any spills right away. ?Avoid walking on wet floors. ?Keep items that you use a lot in easy-to-reach places. ?If you need to reach something above you, use a strong step stool that has a grab bar. ?Keep electrical cords out of the way. ?Do not use floor polish or wax that makes floors slippery. If you must use wax, use non-skid floor wax. ?Do not have throw rugs and other things on the floor that can make you trip. ?What can I do with my stairs? ?Do not leave any items on the stairs. ?Make sure that there are handrails on both sides of the stairs and use them. Fix handrails that are broken or loose. Make sure that handrails are as long as the stairways. ?Check any carpeting to make sure that it is firmly attached to the stairs. Fix any carpet that is loose or worn. ?Avoid having throw rugs at the top or bottom of the stairs. If you do have throw rugs, attach them to the floor with carpet tape. ?Make  sure that you have a light switch at the top of the stairs and the bottom of the stairs. If you do not have them, ask someone to add them for you. ?What else can I do to help prevent falls? ?Wear shoes that: ?Do not have high heels. ?Have rubber bottoms. ?Are comfortable and fit you well. ?Are closed at the toe. Do not wear sandals. ?If you use a stepladder: ?Make sure that it is fully opened. Do not climb a closed stepladder. ?Make sure that both sides of the stepladder are locked into place. ?Ask someone to hold it for you, if possible. ?Clearly mark and make sure that you can see: ?Any grab bars or handrails. ?First and last steps. ?Where the edge of each step is. ?Use tools that help you move around (mobility aids) if they are needed. These  include: ?Canes. ?Walkers. ?Scooters. ?Crutches. ?Turn on the lights when you go into a dark area. Replace any light bulbs as soon as they burn out. ?Set up your furniture so you have a clear path. Avoid moving your f

## 2021-11-26 NOTE — Progress Notes (Signed)
? ?Subjective:  ? Mafalda Mcginniss is a 71 y.o. female who presents for an Initial Medicare Annual Wellness Visit. ? ?I connected with Colletta [place] today by telephone and verified that I am speaking with the correct person using two identifiers. ?Location patient: home ?Location provider: work ?Persons participating in the virtual visit: patient, provider. ?  ?I discussed the limitations, risks, security and privacy concerns of performing an evaluation and management service by telephone and the availability of in person appointments. I also discussed with the patient that there may be a patient responsible charge related to this service. The patient expressed understanding and verbally consented to this telephonic visit.  ?  ?Interactive audio and video telecommunications were attempted between this provider and patient, however failed, due to patient having technical difficulties OR patient did not have access to video capability.  We continued and completed visit with audio only. ? ?  ?Review of Systems    ? ?Cardiac Risk Factors include: advanced age (>49mn, >>54women);diabetes mellitus;dyslipidemia ? ?   ?Objective:  ?  ?Today's Vitals  ? ?There is no height or weight on file to calculate BMI. ? ? ?  11/26/2021  ? 11:23 AM  ?Advanced Directives  ?Does Patient Have a Medical Advance Directive? Yes  ?Type of Advance Directive Living will;Healthcare Power of Attorney  ?Copy of [place]angelyin Chart? No - copy requested  ? ? ?Current Medications (verified) ?Outpatient Encounter Medications as of 11/26/2021  ?Medication Sig  ? BAYER ASPIRIN PO Take 81 mg by mouth.  ? diclofenac Sodium (VOLTAREN) 1 % GEL Apply 1 application topically 4 (four) times daily as needed (knees).  ? diphenhydrAMINE (BENADRYL) 25 MG tablet Take 25 mg by mouth every 6 (six) hours as needed for itching.  ? DULoxetine (CYMBALTA) 60 MG capsule Take 60 mg by mouth daily.  ? haloperidol (HALDOL) 10 MG tablet Take 10 mg by mouth  at bedtime.  ? LINZESS 145 MCG CAPS capsule Take 145 mcg by mouth daily as needed (constipation).  ? metFORMIN (GLUCOPHAGE) 1000 MG tablet Take 1 tablet (1,000 mg total) by mouth 2 (two) times daily.  ? simvastatin (ZOCOR) 20 MG tablet Take 1 tablet (20 mg total) by mouth at bedtime.  ? trolamine salicylate (ASPERCREME) 10 % cream Apply 1 application topically as needed (knee pain).  ? famotidine (PEPCID) 20 MG tablet Take 1 tablet (20 mg total) by mouth daily for 10 days.  ? ?No facility-administered encounter medications on file as of 11/26/2021.  ? ? ?Allergies (verified) ?Lemon oil  ? ?History: ?Past Medical History:  ?Diagnosis Date  ? [place]   ? Hearing loss   ? ?Past Surgical History:  ?Procedure Laterality Date  ? CESAREAN SECTION    ? ?History reviewed. No pertinent family history. ?Social History  ? ?Socioeconomic History  ? Marital status: Single  ?  Spouse name: Not on file  ? Number of children: Not on file  ? Years of education: Not on file  ? Highest education level: Not on file  ?Occupational History  ? Not on file  ?Tobacco Use  ? Smoking status: Former  ?  Types: Cigarettes  ?  Quit date: 08/31/1998  ?  Years since quitting: 23.2  ? Smokeless tobacco: Never  ?Vaping Use  ? Vaping Use: Never used  ?Substance and Sexual Activity  ? Alcohol use: Yes  ?  Alcohol/week: 1.0 standard drink  ?  Types: 1 Glasses of wine per week  ?  Comment: rare once a year  ? Drug use: Never  ? Sexual activity: Not Currently  ?Other Topics Concern  ? Not on file  ?Social History Narrative  ? Not on file  ? ?Social Determinants of Health  ? ?Financial Resource Strain: Low Risk   ? Difficulty of Paying Living Expenses: Not hard at all  ?Food Insecurity: No Food Insecurity  ? Worried About Charity fundraiser in the Last Year: Never true  ? Ran Out of Food in the Last Year: Never true  ?Transportation Needs: No Transportation Needs  ? Lack of Transportation (Medical): No  ? Lack of  Transportation (Non-Medical): No  ?Physical Activity: Sufficiently Active  ? Days of Exercise per Week: 5 days  ? Minutes of Exercise per Session: 40 min  ?Stress: No Stress Concern Present  ? Feeling of Stress : Not at all  ?Social Connections: Moderately Isolated  ? Frequency of Communication with Friends and Family: Three times a week  ? Frequency of Social Gatherings with Friends and Family: Three times a week  ? Attends Religious Services: More than 4 times per year  ? Active Member of Clubs or Organizations: No  ? Attends Archivist Meetings: Never  ? Marital Status: Never married  ? ? ?Tobacco Counseling ?Counseling given: Not Answered ? ? ?Clinical Intake: ? ?  ? ?Pain : No/denies pain ? ?  ? ?Nutritional Risks: None ?Diabetes: Yes ?CBG done?: No ?Did pt. bring in CBG monitor from home?: No ? ?How often do you need to have someone help you when you read instructions, pamphlets, or other written materials from your doctor or pharmacy?: 1 - Never ?What is the last grade level you completed in school?: college ? ?Diabetic?yes ?Nutrition Risk Assessment: ? ?Has the patient had any N/V/D within the last 2 months?  No  ?Does the patient have any non-healing wounds?  No  ?Has the patient had any unintentional weight loss or weight gain?  No  ? ?Diabetes: ? ?Is the patient diabetic?  Yes  ?If diabetic, was a CBG obtained today?  No  ?Did the patient bring in their glucometer from home?  No  ?How often do you monitor your CBG's? Never .  ? ?Financial Strains and Diabetes Management: ? ?Are you having any financial strains with the device, your supplies or your medication? No .  ?Does the patient want to be seen by Chronic Care Management for management of their diabetes?  No  ?Would the patient like to be referred to a Nutritionist or for Diabetic Management?  No  ? ?Diabetic Exams: ? ?Diabetic Eye Exam: Completed schedule 11/2021 ?Diabetic Foot Exam: Overdue, Pt has been advised about the importance in  completing this exam. Pt is scheduled for diabetic foot exam on next office visit .  ? ?Interpreter Needed?: No ? ?Information entered by :: [email or web address] ? ? ?Activities of Daily Living ? ?  11/26/2021  ? 11:24 AM  ?In your present state of health, do you have any difficulty performing the following activities:  ?Hearing? 0  ?Vision? 0  ?Difficulty concentrating or making decisions? 0  ?Walking or climbing stairs? 0  ?Dressing or bathing? 0  ?Doing errands, shopping? 0  ?Preparing Food and eating ? N  ?Using the Toilet? N  ?In the past six months, have you accidently leaked urine? N  ?Do you have problems with loss of bowel control? N  ?Managing your Medications? N  ?Managing your Finances? N  ?Housekeeping or managing  your Housekeeping? N  ? ? ?Patient Care Team: ?Libby Maw, MD as PCP - General (Family Medicine) ? ?Indicate any recent Medical Services you may have received from other than [facility] providers in the past year (date may be approximate). ? ?   ?Assessment:  ? This is a routine wellness examination for Jessye. ? ?Hearing/Vision screen ?Vision Screening - Comments:: Schedule 11/2021 wears glasses ? ?Dietary issues and exercise activities discussed: ?Current Exercise Habits: Home exercise routine, Type of exercise: walking, Time (Minutes): 40, Frequency (Times/Week): 5, Weekly Exercise (Minutes/Week): 200, Intensity: Mild, Exercise limited by: None identified ? ? Goals Addressed   ?None ?  ? ?Depression Screen ? ?  11/26/2021  ? 11:24 AM 11/26/2021  ? 11:21 AM 10/20/2021  ?  9:49 AM 09/04/2021  ? 11:29 AM 07/23/2021  ?  1:44 PM 07/23/2021  ?  1:13 PM  ?PHQ 2/9 Scores  ?PHQ - 2 Score 0 0 0 0 0 0  ?PHQ- 9 Score     0   ?  ?Fall Risk ? ?  11/26/2021  ? 11:24 AM 10/20/2021  ?  9:49 AM 09/04/2021  ? 11:29 AM 07/23/2021  ?  1:15 PM  ?Fall Risk   ?Falls in the past year? 0 0 0 0  ?Number falls in past yr: 0 0  0  ?Injury with Fall? 0     ?Follow up Falls evaluation completed     ? ? ?FALL RISK PREVENTION  PERTAINING TO THE HOME: ? ?Any stairs in or around the home? No  ?If so, are there any without handrails? No  ?Home free of loose throw rugs in walkways, pet beds, electrical cords, etc? Yes  ?Adequate lighting in your

## 2021-12-11 DIAGNOSIS — Z79899 Other long term (current) drug therapy: Secondary | ICD-10-CM | POA: Diagnosis not present

## 2021-12-11 DIAGNOSIS — D352 Benign neoplasm of pituitary gland: Secondary | ICD-10-CM | POA: Diagnosis not present

## 2021-12-11 DIAGNOSIS — Z1159 Encounter for screening for other viral diseases: Secondary | ICD-10-CM | POA: Diagnosis not present

## 2021-12-11 DIAGNOSIS — E559 Vitamin D deficiency, unspecified: Secondary | ICD-10-CM | POA: Diagnosis not present

## 2021-12-11 DIAGNOSIS — E119 Type 2 diabetes mellitus without complications: Secondary | ICD-10-CM | POA: Diagnosis not present

## 2021-12-11 DIAGNOSIS — Z0001 Encounter for general adult medical examination with abnormal findings: Secondary | ICD-10-CM | POA: Diagnosis not present

## 2021-12-11 DIAGNOSIS — Z0000 Encounter for general adult medical examination without abnormal findings: Secondary | ICD-10-CM | POA: Diagnosis not present

## 2021-12-11 DIAGNOSIS — Z23 Encounter for immunization: Secondary | ICD-10-CM | POA: Diagnosis not present

## 2021-12-11 DIAGNOSIS — B351 Tinea unguium: Secondary | ICD-10-CM | POA: Diagnosis not present

## 2021-12-11 DIAGNOSIS — R42 Dizziness and giddiness: Secondary | ICD-10-CM | POA: Diagnosis not present

## 2021-12-23 DIAGNOSIS — H905 Unspecified sensorineural hearing loss: Secondary | ICD-10-CM | POA: Diagnosis not present

## 2021-12-25 DIAGNOSIS — Z79899 Other long term (current) drug therapy: Secondary | ICD-10-CM | POA: Diagnosis not present

## 2021-12-25 DIAGNOSIS — Z0000 Encounter for general adult medical examination without abnormal findings: Secondary | ICD-10-CM | POA: Diagnosis not present

## 2021-12-25 DIAGNOSIS — K439 Ventral hernia without obstruction or gangrene: Secondary | ICD-10-CM | POA: Diagnosis not present

## 2021-12-25 DIAGNOSIS — E119 Type 2 diabetes mellitus without complications: Secondary | ICD-10-CM | POA: Diagnosis not present

## 2021-12-25 DIAGNOSIS — E209 Hypoparathyroidism, unspecified: Secondary | ICD-10-CM | POA: Diagnosis not present

## 2021-12-25 DIAGNOSIS — Z0001 Encounter for general adult medical examination with abnormal findings: Secondary | ICD-10-CM | POA: Diagnosis not present

## 2021-12-25 DIAGNOSIS — H9190 Unspecified hearing loss, unspecified ear: Secondary | ICD-10-CM | POA: Diagnosis not present

## 2021-12-25 DIAGNOSIS — Z794 Long term (current) use of insulin: Secondary | ICD-10-CM | POA: Diagnosis not present

## 2021-12-25 DIAGNOSIS — M1990 Unspecified osteoarthritis, unspecified site: Secondary | ICD-10-CM | POA: Diagnosis not present

## 2021-12-26 ENCOUNTER — Ambulatory Visit: Payer: Medicare Other | Admitting: Podiatry

## 2021-12-29 ENCOUNTER — Ambulatory Visit (INDEPENDENT_AMBULATORY_CARE_PROVIDER_SITE_OTHER): Payer: Medicare Other | Admitting: Podiatry

## 2021-12-29 ENCOUNTER — Encounter: Payer: Self-pay | Admitting: Podiatry

## 2021-12-29 DIAGNOSIS — M79675 Pain in left toe(s): Secondary | ICD-10-CM | POA: Diagnosis not present

## 2021-12-29 DIAGNOSIS — B351 Tinea unguium: Secondary | ICD-10-CM | POA: Diagnosis not present

## 2021-12-29 DIAGNOSIS — E1165 Type 2 diabetes mellitus with hyperglycemia: Secondary | ICD-10-CM | POA: Diagnosis not present

## 2021-12-29 DIAGNOSIS — M79674 Pain in right toe(s): Secondary | ICD-10-CM

## 2021-12-29 DIAGNOSIS — N179 Acute kidney failure, unspecified: Secondary | ICD-10-CM

## 2021-12-29 NOTE — Progress Notes (Signed)
This patient presents to the office with chief complaint of long thick painful nails.  Patient says the nails are painful walking and wearing shoes.  This patient is unable to self treat.  This patient is unable to trim her nails since she is unable to reach her nails.  She presents to the office for preventative foot care services.  General Appearance  Alert, conversant and in no acute stress.  Vascular  Dorsalis pedis and posterior tibial  pulses are palpable  bilaterally.  Capillary return is within normal limits  bilaterally. Temperature is within normal limits  bilaterally.  Neurologic  Senn-Weinstein monofilament wire test within normal limits  bilaterally. Muscle power within normal limits bilaterally.  Nails Thick disfigured discolored nails with subungual debris  hallux nails  bilaterally. No evidence of bacterial infection or drainage bilaterally.  Orthopedic  No limitations of motion  feet .  No crepitus or effusions noted.  No bony pathology or digital deformities noted.  Skin  normotropic skin with no porokeratosis noted bilaterally.  No signs of infections or ulcers noted.     Onychomycosis  Nails  B/L.  Pain in right toes  Pain in left toes  Debridement of nails both feet followed trimming the nails with dremel tool.    RTC 3 months.   Youcef Klas DPM   

## 2022-01-09 LAB — HM DIABETES EYE EXAM

## 2022-01-15 ENCOUNTER — Encounter: Payer: Self-pay | Admitting: Family Medicine

## 2022-01-22 ENCOUNTER — Ambulatory Visit (INDEPENDENT_AMBULATORY_CARE_PROVIDER_SITE_OTHER): Payer: Medicare Other | Admitting: Family Medicine

## 2022-01-22 ENCOUNTER — Encounter: Payer: Self-pay | Admitting: Family Medicine

## 2022-01-22 VITALS — BP 132/84 | HR 65 | Temp 97.4°F | Wt 191.4 lb

## 2022-01-22 DIAGNOSIS — E7800 Pure hypercholesterolemia, unspecified: Secondary | ICD-10-CM | POA: Diagnosis not present

## 2022-01-22 DIAGNOSIS — F209 Schizophrenia, unspecified: Secondary | ICD-10-CM | POA: Diagnosis not present

## 2022-01-22 DIAGNOSIS — E1165 Type 2 diabetes mellitus with hyperglycemia: Secondary | ICD-10-CM | POA: Diagnosis not present

## 2022-01-22 DIAGNOSIS — D352 Benign neoplasm of pituitary gland: Secondary | ICD-10-CM | POA: Diagnosis not present

## 2022-01-22 LAB — HEMOGLOBIN A1C: Hgb A1c MFr Bld: 8.2 % — ABNORMAL HIGH (ref 4.6–6.5)

## 2022-01-22 LAB — CBC
HCT: 37.8 % (ref 36.0–46.0)
Hemoglobin: 11.9 g/dL — ABNORMAL LOW (ref 12.0–15.0)
MCHC: 31.6 g/dL (ref 30.0–36.0)
MCV: 79.9 fl (ref 78.0–100.0)
Platelets: 298 10*3/uL (ref 150.0–400.0)
RBC: 4.73 Mil/uL (ref 3.87–5.11)
RDW: 14 % (ref 11.5–15.5)
WBC: 9.9 10*3/uL (ref 4.0–10.5)

## 2022-01-22 LAB — COMPREHENSIVE METABOLIC PANEL
ALT: 21 U/L (ref 0–35)
AST: 22 U/L (ref 0–37)
Albumin: 4.3 g/dL (ref 3.5–5.2)
Alkaline Phosphatase: 72 U/L (ref 39–117)
BUN: 12 mg/dL (ref 6–23)
CO2: 34 mEq/L — ABNORMAL HIGH (ref 19–32)
Calcium: 10.3 mg/dL (ref 8.4–10.5)
Chloride: 102 mEq/L (ref 96–112)
Creatinine, Ser: 0.77 mg/dL (ref 0.40–1.20)
GFR: 78.65 mL/min (ref >60.00)
Glucose, Bld: 115 mg/dL — ABNORMAL HIGH (ref 70–99)
Potassium: 4.2 mEq/L (ref 3.5–5.1)
Sodium: 140 mEq/L (ref 135–145)
Total Bilirubin: 0.4 mg/dL (ref 0.2–1.2)
Total Protein: 7.2 g/dL (ref 6.0–8.3)

## 2022-01-22 LAB — LIPID PANEL
Cholesterol: 146 mg/dL (ref 0–200)
HDL: 47.6 mg/dL (ref >39.00)
NonHDL: 98.29
Total CHOL/HDL Ratio: 3
Triglycerides: 208 mg/dL — ABNORMAL HIGH (ref 0.0–149.0)
VLDL: 41.6 mg/dL — ABNORMAL HIGH (ref 0.0–40.0)

## 2022-01-22 LAB — LDL CHOLESTEROL, DIRECT: Direct LDL: 81 mg/dL

## 2022-01-22 NOTE — Progress Notes (Signed)
Established Patient Office Visit  Subjective   Patient ID: Kirstein Baxley, female    DOB: 1950-09-15  Age: 71 y.o. MRN: [id number]  Chief Complaint  Patient presents with   Follow-up    Patient is here for 3 month follow up. Patient states she needs refill on haloperidol, Simvastatin, Metformin, and Cymbalta.Patient is nonfasting. Patient is aware that Dexa scan is due.     HPI for follow-up of diabetes, elevated cholesterol and pituitary macroadenoma.  As far she knows she is asymptomatic macroadenoma.  She has increased thirst, frequency of urination.  Bloating.  Diabetes has been well controlled with metformin.  Simvastatin controls her cholesterol.  There was no follow-up with neurosurgery from last visit.  She had thought the referral was about a bump on her head and did not go.    Review of Systems  Constitutional:  Negative for chills, diaphoresis, malaise/fatigue and weight loss.  HENT: Negative.    Eyes: Negative.  Negative for blurred vision and double vision.  Cardiovascular:  Negative for chest pain.  Gastrointestinal:  Negative for abdominal pain.  Genitourinary: Negative.  Negative for frequency.  Musculoskeletal:  Negative for falls and myalgias.  Neurological:  Negative for speech change, loss of consciousness, weakness and headaches.  Psychiatric/Behavioral: Negative.       Objective:     BP 132/84 (BP Location: Left Arm, Patient Position: Sitting, Cuff Size: Large)   Pulse 65   Temp (!) 97.4 F (36.3 C) (Temporal)   Wt 191 lb 6.4 oz (86.8 kg)   SpO2 94%   BMI 36.16 kg/m    Physical Exam Constitutional:      General: She is not in acute distress.    Appearance: Normal appearance. She is not ill-appearing, toxic-appearing or diaphoretic.  HENT:     Head: Normocephalic and atraumatic.     Right Ear: External ear normal.     Left Ear: External ear normal.     Mouth/Throat:     Mouth: Mucous membranes are moist.     Pharynx: Oropharynx is clear. No  oropharyngeal exudate or posterior oropharyngeal erythema.  Eyes:     General: No scleral icterus.       Right eye: No discharge.        Left eye: No discharge.     Extraocular Movements: Extraocular movements intact.     Conjunctiva/sclera: Conjunctivae normal.     Pupils: Pupils are equal, round, and reactive to light.  Cardiovascular:     Rate and Rhythm: Normal rate and regular rhythm.  Pulmonary:     Effort: Pulmonary effort is normal. No respiratory distress.     Breath sounds: Normal breath sounds.  Abdominal:     General: Bowel sounds are normal.  Musculoskeletal:     Cervical back: No rigidity or tenderness.     Right lower leg: No edema.     Left lower leg: No edema.  Skin:    General: Skin is warm and dry.  Neurological:     Mental Status: She is alert and oriented to person, place, and time.  Psychiatric:        Mood and Affect: Mood normal.        Behavior: Behavior normal.     No results found for any visits on 01/22/22.    The 10-year ASCVD risk score (Arnett DK, et al., 2019) is: 20.6%    Assessment & Plan:   Problem List Items Addressed This Visit       Endocrine  Type 2 diabetes mellitus with hyperglycemia (HCC)   Relevant Orders   CBC   Comprehensive metabolic panel   Hemoglobin A1c   Other Visit Diagnoses     Pituitary macroadenoma ([place])    -  Primary   Relevant Orders   Ambulatory referral to Neurosurgery   Elevated cholesterol       Relevant Orders   Comprehensive metabolic panel   Lipid panel   Schizophrenia, unspecified type ([place])           Return in about 3 months (around 04/24/2022).  Urged patient to go for neurosurgical consultation about her expanding pituitary macroadenoma.  She said that she would.  She will follow-up with psychiatry for duloxetine and Haldol.  Rechecking lipids and hemoglobin A1c.  We will make medicine adjustments as needed after those labs are resulted.  Libby Maw, MD

## 2022-01-23 ENCOUNTER — Encounter: Payer: Self-pay | Admitting: Family Medicine

## 2022-01-23 ENCOUNTER — Telehealth (INDEPENDENT_AMBULATORY_CARE_PROVIDER_SITE_OTHER): Payer: Medicare Other | Admitting: Family Medicine

## 2022-01-23 DIAGNOSIS — K219 Gastro-esophageal reflux disease without esophagitis: Secondary | ICD-10-CM

## 2022-01-23 DIAGNOSIS — E7800 Pure hypercholesterolemia, unspecified: Secondary | ICD-10-CM

## 2022-01-23 DIAGNOSIS — K581 Irritable bowel syndrome with constipation: Secondary | ICD-10-CM

## 2022-01-23 DIAGNOSIS — E1165 Type 2 diabetes mellitus with hyperglycemia: Secondary | ICD-10-CM | POA: Diagnosis not present

## 2022-01-23 MED ORDER — FAMOTIDINE 20 MG PO TABS: 20.0000 mg | ORAL_TABLET | Freq: Every day | ORAL | 0 refills | Status: DC

## 2022-01-23 MED ORDER — LINZESS 145 MCG PO CAPS: 145.0000 ug | ORAL_CAPSULE | Freq: Every day | ORAL | 2 refills | Status: DC | PRN

## 2022-01-23 MED ORDER — METFORMIN HCL 1000 MG PO TABS: 1000.0000 mg | ORAL_TABLET | Freq: Two times a day (BID) | ORAL | 1 refills | Status: DC

## 2022-01-23 MED ORDER — SIMVASTATIN 40 MG PO TABS: 40.0000 mg | ORAL_TABLET | Freq: Every day | ORAL | 3 refills | Status: DC

## 2022-01-23 NOTE — Progress Notes (Signed)
Established Patient Office Visit  Subjective   Patient ID: Margaret Bailey, female    DOB: 26-Feb-1951  Age: 71 y.o. MRN: [id number]  Chief Complaint  Patient presents with   Advice Only    Discuss labs     HPI follow-up of labs that show a 0.8 elevation in her hemoglobin A1c and an LDL cholesterol at 92.  She is compliant with her medications.  She admits to dietary indiscretion over the last several months.  She enjoys sweets and drinks.  She is getting some exercise by walking at least 20 minutes daily.  She is taking Linzess as needed over the last several years for irritable bowel associated with constipation.    Review of Systems  Constitutional:  Negative for chills, diaphoresis, malaise/fatigue and weight loss.  HENT: Negative.    Eyes: Negative.  Negative for blurred vision and double vision.  Cardiovascular:  Negative for chest pain.  Gastrointestinal:  Positive for constipation. Negative for abdominal pain, blood in stool and melena.  Genitourinary: Negative.   Musculoskeletal:  Negative for falls and myalgias.  Neurological:  Negative for speech change, loss of consciousness and weakness.  Psychiatric/Behavioral: Negative.       Objective:     There were no vitals taken for this visit.   Physical Exam Pulmonary:     Effort: Pulmonary effort is normal.  Neurological:     Mental Status: She is alert and oriented to person, place, and time.  Psychiatric:        Mood and Affect: Mood normal.        Behavior: Behavior normal.     No results found for any visits on 01/23/22.    The 10-year ASCVD risk score (Arnett DK, et al., 2019) is: 19.5%    Assessment & Plan:   Problem List Items Addressed This Visit       Digestive   Irritable bowel syndrome with constipation - Primary   Relevant Medications   LINZESS 145 MCG CAPS capsule   famotidine (PEPCID) 20 MG tablet   Gastroesophageal reflux disease   Relevant Medications   LINZESS 145 MCG CAPS  capsule   famotidine (PEPCID) 20 MG tablet     Endocrine   Type 2 diabetes mellitus with hyperglycemia (HCC)   Relevant Medications   metFORMIN (GLUCOPHAGE) 1000 MG tablet   simvastatin (ZOCOR) 40 MG tablet     Other   Elevated cholesterol   Relevant Medications   simvastatin (ZOCOR) 40 MG tablet    No follow-ups on file.  Has follow-up scheduled in 3 months.  Agrees to do better with her diet.  She will avoid all sweetsy drinks and minimize carbohydrate intake.  Medications as directed.  She will let us know if she has not heard from neurosurgery in the next 3 to 4 weeks.   Virtual Visit via Telephone Note  I connected with Colletta Maryland on 01/23/22 at  1:40 PM EDT by telephone and verified that I am speaking with the correct person using two identifiers.  Location: Patient: Home at her daughter's house.   Provider: work    I discussed the limitations, risks, security and privacy concerns of performing an evaluation and management service by telephone and the availability of in person appointments. I also discussed with the patient that there may be a patient responsible charge related to this service. The patient expressed understanding and agreed to proceed.   History of Present Illness:    Observations/Objective:   Assessment and Plan:  Follow Up Instructions:    I discussed the assessment and treatment plan with the patient. The patient was provided an opportunity to ask questions and all were answered. The patient agreed with the plan and demonstrated an understanding of the instructions.   The patient was advised to call back or seek an in-person evaluation if the symptoms worsen or if the condition fails to improve as anticipated.  I provided 20 minutes of non-face-to-face time during this encounter.   Libby Maw, MD   Libby Maw, MD  Interactive video and audio telecommunications were attempted between myself and the patient.  However they failed due to the patient having technical difficulties or not having access to video capability. We continued and completed with audio only.

## 2022-04-02 ENCOUNTER — Other Ambulatory Visit: Payer: Self-pay | Admitting: Family Medicine

## 2022-04-02 DIAGNOSIS — E1165 Type 2 diabetes mellitus with hyperglycemia: Secondary | ICD-10-CM

## 2022-04-03 ENCOUNTER — Encounter: Payer: Self-pay | Admitting: Podiatry

## 2022-04-03 ENCOUNTER — Ambulatory Visit (INDEPENDENT_AMBULATORY_CARE_PROVIDER_SITE_OTHER): Payer: Medicare Other | Admitting: Podiatry

## 2022-04-03 DIAGNOSIS — M79674 Pain in right toe(s): Secondary | ICD-10-CM

## 2022-04-03 DIAGNOSIS — B351 Tinea unguium: Secondary | ICD-10-CM

## 2022-04-03 DIAGNOSIS — E1165 Type 2 diabetes mellitus with hyperglycemia: Secondary | ICD-10-CM | POA: Diagnosis not present

## 2022-04-03 DIAGNOSIS — M79675 Pain in left toe(s): Secondary | ICD-10-CM | POA: Diagnosis not present

## 2022-04-03 NOTE — Progress Notes (Signed)
This patient presents to the office with chief complaint of long thick painful nails.  Patient says the nails are painful walking and wearing shoes.  This patient is unable to self treat.  This patient is unable to trim her nails since she is unable to reach her nails.  She presents to the office for preventative foot care services.  General Appearance  Alert, conversant and in no acute stress.  Vascular  Dorsalis pedis and posterior tibial  pulses are palpable  bilaterally.  Capillary return is within normal limits  bilaterally. Temperature is within normal limits  bilaterally.  Neurologic  Senn-Weinstein monofilament wire test within normal limits  bilaterally. Muscle power within normal limits bilaterally.  Nails Thick disfigured discolored nails with subungual debris  hallux  nailsbilaterally. No evidence of bacterial infection or drainage bilaterally.  Orthopedic  No limitations of motion  feet .  No crepitus or effusions noted.  No bony pathology or digital deformities noted.  Skin  normotropic skin with no porokeratosis noted bilaterally.  No signs of infections or ulcers noted.     Onychomycosis  Nails  B/L.  Pain in right toes  Pain in left toes  Debridement of nails both feet followed trimming the nails with dremel tool.    RTC 4 months.   Jahn Franchini DPM   

## 2022-04-09 ENCOUNTER — Other Ambulatory Visit: Payer: Self-pay | Admitting: Family Medicine

## 2022-04-09 DIAGNOSIS — K581 Irritable bowel syndrome with constipation: Secondary | ICD-10-CM

## 2022-04-28 ENCOUNTER — Encounter: Payer: Self-pay | Admitting: Family Medicine

## 2022-04-28 ENCOUNTER — Ambulatory Visit (INDEPENDENT_AMBULATORY_CARE_PROVIDER_SITE_OTHER): Payer: Medicare Other | Admitting: Family Medicine

## 2022-04-28 VITALS — BP 132/70 | HR 85 | Temp 97.0°F | Ht 61.0 in | Wt 186.6 lb

## 2022-04-28 DIAGNOSIS — K219 Gastro-esophageal reflux disease without esophagitis: Secondary | ICD-10-CM

## 2022-04-28 DIAGNOSIS — D649 Anemia, unspecified: Secondary | ICD-10-CM | POA: Diagnosis not present

## 2022-04-28 DIAGNOSIS — E611 Iron deficiency: Secondary | ICD-10-CM

## 2022-04-28 DIAGNOSIS — E1165 Type 2 diabetes mellitus with hyperglycemia: Secondary | ICD-10-CM

## 2022-04-28 DIAGNOSIS — Z1159 Encounter for screening for other viral diseases: Secondary | ICD-10-CM | POA: Diagnosis not present

## 2022-04-28 DIAGNOSIS — E7800 Pure hypercholesterolemia, unspecified: Secondary | ICD-10-CM

## 2022-04-28 LAB — BASIC METABOLIC PANEL
BUN: 15 mg/dL (ref 6–23)
CO2: 27 mEq/L (ref 19–32)
Calcium: 10.9 mg/dL — ABNORMAL HIGH (ref 8.4–10.5)
Chloride: 101 mEq/L (ref 96–112)
Creatinine, Ser: 0.85 mg/dL (ref 0.40–1.20)
GFR: 69.73 mL/min (ref >60.00)
Glucose, Bld: 110 mg/dL — ABNORMAL HIGH (ref 70–99)
Potassium: 3.8 mEq/L (ref 3.5–5.1)
Sodium: 142 mEq/L (ref 135–145)

## 2022-04-28 LAB — CBC
HCT: 39.8 % (ref 36.0–46.0)
Hemoglobin: 12.6 g/dL (ref 12.0–15.0)
MCHC: 31.8 g/dL (ref 30.0–36.0)
MCV: 80.4 fl (ref 78.0–100.0)
Platelets: 288 10*3/uL (ref 150.0–400.0)
RBC: 4.94 Mil/uL (ref 3.87–5.11)
RDW: 14.7 % (ref 11.5–15.5)
WBC: 9.5 10*3/uL (ref 4.0–10.5)

## 2022-04-28 LAB — HEMOGLOBIN A1C: Hgb A1c MFr Bld: 8.4 % — ABNORMAL HIGH (ref 4.6–6.5)

## 2022-04-28 LAB — B12 AND FOLATE PANEL
Folate: >23.9 ng/mL (ref >5.9)
Vitamin B-12: 374 pg/mL (ref 211–911)

## 2022-04-28 LAB — LDL CHOLESTEROL, DIRECT: Direct LDL: 62 mg/dL

## 2022-04-28 MED ORDER — METFORMIN HCL 1000 MG PO TABS: 1000.0000 mg | ORAL_TABLET | Freq: Two times a day (BID) | ORAL | 3 refills | Status: DC

## 2022-04-28 MED ORDER — SIMVASTATIN 40 MG PO TABS: 40.0000 mg | ORAL_TABLET | Freq: Every day | ORAL | 3 refills | Status: DC

## 2022-04-28 MED ORDER — FARXIGA 10 MG PO TABS: 10.0000 mg | ORAL_TABLET | Freq: Every day | ORAL | 3 refills | Status: DC

## 2022-04-28 MED ORDER — FAMOTIDINE 20 MG PO TABS: ORAL_TABLET | ORAL | 2 refills | Status: DC

## 2022-04-28 NOTE — Progress Notes (Addendum)
Established Patient Office Visit  Subjective   Patient ID: Margaret Bailey, female    DOB: 10-07-1950  Age: 71 y.o. MRN: [id number]  Chief Complaint  Patient presents with   Follow-up    3 month f/u.  Not fasting today.   C/o having skin tag on LT side.     HPI follow-up of elevated cholesterol, type 2 diabetes, GERD, microcytic anemia.  She is followed by GI for irritable bowel syndrome.  Microcytic anemia noted at last visit.  Denies blood in her stool or urine.  Famotidine helps.  She is taking 40 mg of simvastatin daily.  She is taking at 1000 mg of metformin twice daily.  She just started on Farxiga a week and a half ago.  Recently sent a Cologuard off for analysis.  Saw neurosurgery for her macroadenoma.     Review of Systems  Constitutional: Negative.   HENT: Negative.    Eyes:  Negative for blurred vision, discharge and redness.  Respiratory: Negative.    Cardiovascular: Negative.   Gastrointestinal:  Negative for abdominal pain.  Genitourinary: Negative.   Musculoskeletal: Negative.  Negative for myalgias.  Skin:  Negative for rash.  Neurological:  Negative for tingling, loss of consciousness and weakness.  Endo/Heme/Allergies:  Negative for polydipsia.      Objective:     BP 132/70   Pulse 85   Temp (!) 97 F (36.1 C) (Temporal)   Ht '5\' 1"'$  (1.549 m)   Wt 186 lb 9.6 oz (84.6 kg)   SpO2 95%   BMI 35.26 kg/m  BP Readings from Last 3 Encounters:  04/28/22 132/70  01/22/22 132/84  10/20/21 134/68   Wt Readings from Last 3 Encounters:  04/28/22 186 lb 9.6 oz (84.6 kg)  01/22/22 191 lb 6.4 oz (86.8 kg)  10/20/21 186 lb 3.2 oz (84.5 kg)      Physical Exam Constitutional:      General: She is not in acute distress.    Appearance: Normal appearance. She is not ill-appearing, toxic-appearing or diaphoretic.  HENT:     Head: Normocephalic and atraumatic.     Right Ear: External ear normal.     Left Ear: External ear normal.     Mouth/Throat:     Mouth:  Mucous membranes are moist.     Pharynx: Oropharynx is clear. No oropharyngeal exudate or posterior oropharyngeal erythema.  Eyes:     General: No scleral icterus.       Right eye: No discharge.        Left eye: No discharge.     Extraocular Movements: Extraocular movements intact.     Conjunctiva/sclera: Conjunctivae normal.     Pupils: Pupils are equal, round, and reactive to light.  Cardiovascular:     Rate and Rhythm: Normal rate and regular rhythm.     Pulses:          Dorsalis pedis pulses are 2+ on the right side and 2+ on the left side.       Posterior tibial pulses are 1+ on the right side and 1+ on the left side.  Pulmonary:     Effort: Pulmonary effort is normal. No respiratory distress.     Breath sounds: Normal breath sounds.  Musculoskeletal:     Cervical back: No rigidity or tenderness.  Skin:    General: Skin is warm and dry.  Neurological:     Mental Status: She is alert and oriented to person, place, and time.  Psychiatric:  Mood and Affect: Mood normal.        Behavior: Behavior normal.    Diabetic Foot Exam - Simple   Simple Foot Form Diabetic Foot exam was performed with the following findings: Yes 04/28/2022  8:30 AM  Visual Inspection See comments: Yes Sensation Testing Intact to touch and monofilament testing bilaterally: Yes Pulse Check See comments: Yes Comments Feet are cavus bilaterally without lesions or ulcerations.      Results for orders placed or performed in visit on 76/73/41  Basic metabolic panel  Result Value Ref Range   Sodium 142 135 - 145 mEq/L   Potassium 3.8 3.5 - 5.1 mEq/L   Chloride 101 96 - 112 mEq/L   CO2 27 19 - 32 mEq/L   Glucose, Bld 110 (H) 70 - 99 mg/dL   BUN 15 6 - 23 mg/dL   Creatinine, Ser 0.85 0.40 - 1.20 mg/dL   GFR 69.73 >60.00 mL/min   Calcium 10.9 (H) 8.4 - 10.5 mg/dL  CBC  Result Value Ref Range   WBC 9.5 4.0 - 10.5 K/uL   RBC 4.94 3.87 - 5.11 Mil/uL   Platelets 288.0 150.0 - 400.0 K/uL    Hemoglobin 12.6 12.0 - 15.0 g/dL   HCT 39.8 36.0 - 46.0 %   MCV 80.4 78.0 - 100.0 fl   MCHC 31.8 30.0 - 36.0 g/dL   RDW 14.7 11.5 - 15.5 %  LDL cholesterol, direct  Result Value Ref Range   Direct LDL 62.0 mg/dL  Hemoglobin A1c  Result Value Ref Range   Hgb A1c MFr Bld 8.4 (H) 4.6 - 6.5 %  Iron, TIBC and Ferritin Panel  Result Value Ref Range   Iron 51 45 - 160 mcg/dL   TIBC 388 250 - 450 mcg/dL (calc)   %SAT 13 (L) 16 - 45 % (calc)   Ferritin 10 (L) 16 - 288 ng/mL  B12 and Folate Panel  Result Value Ref Range   Vitamin B-12 374 211 - 911 pg/mL   Folate >23.9 >5.9 ng/mL  Hepatitis C antibody  Result Value Ref Range   Hepatitis C Ab NON-REACTIVE NON-REACTIVE      The 10-year ASCVD risk score (Arnett DK, et al., 2019) is: 19.5%    Assessment & Plan:   Problem List Items Addressed This Visit       Digestive   Gastroesophageal reflux disease   Relevant Medications   famotidine (PEPCID) 20 MG tablet     Endocrine   Type 2 diabetes mellitus with hyperglycemia, without long-term current use of insulin (HCC) - Primary   Relevant Medications   FARXIGA 10 MG TABS tablet   metFORMIN (GLUCOPHAGE) 1000 MG tablet   simvastatin (ZOCOR) 40 MG tablet   Other Relevant Orders   Basic metabolic panel (Completed)   Hemoglobin A1c (Completed)     Other   Elevated cholesterol   Relevant Medications   simvastatin (ZOCOR) 40 MG tablet   Other Relevant Orders   LDL cholesterol, direct (Completed)   Encounter for hepatitis C screening test for low risk patient   Relevant Orders   Hepatitis C antibody (Completed)   Iron deficiency   Relevant Medications   Iron, Ferrous Sulfate, 325 (65 Fe) MG TABS   Anemia   Relevant Medications   Iron, Ferrous Sulfate, 325 (65 Fe) MG TABS   Other Relevant Orders   CBC (Completed)   Iron, TIBC and Ferritin Panel (Completed)   B12 and Folate Panel (Completed)    Return in about  3 months (around 07/29/2022).  Continue Farxiga with  metformin.  Patient just started the [place] a week and a half ago.  Continue Zocor 40 mg daily.  May need to change to a stronger statin.  Checking iron and B12 levels for anemia.  Continue Pepcid for GERD because it apparently has been helping her.  Libby Maw, MD

## 2022-04-29 LAB — IRON,TIBC AND FERRITIN PANEL
%SAT: 13 % (calc) — ABNORMAL LOW (ref 16–45)
Ferritin: 10 ng/mL — ABNORMAL LOW (ref 16–288)
Iron: 51 ug/dL (ref 45–160)
TIBC: 388 mcg/dL (calc) (ref 250–450)

## 2022-04-29 LAB — HEPATITIS C ANTIBODY: Hepatitis C Ab: NONREACTIVE

## 2022-05-05 DIAGNOSIS — D649 Anemia, unspecified: Secondary | ICD-10-CM | POA: Insufficient documentation

## 2022-05-05 DIAGNOSIS — E611 Iron deficiency: Secondary | ICD-10-CM | POA: Insufficient documentation

## 2022-05-05 MED ORDER — IRON (FERROUS SULFATE) 325 (65 FE) MG PO TABS: 325.0000 mg | ORAL_TABLET | Freq: Every day | ORAL | 1 refills | Status: DC

## 2022-05-05 NOTE — Addendum Note (Signed)
Addended by: Jon Billings on: 05/05/2022 12:24 PM   Modules accepted: Orders

## 2022-06-22 ENCOUNTER — Telehealth: Payer: Self-pay | Admitting: Family Medicine

## 2022-06-22 NOTE — Telephone Encounter (Signed)
Caller Name: Lynn Ito w/[facility]  MEDICATION(S):  States fax was sent requesting prescription for all active medications to be sent in - pt has mail order pharmacy with [facility] through her insurance  I have requested she refax request

## 2022-06-25 NOTE — Telephone Encounter (Signed)
Forms received on Providers desk to be filled out

## 2022-06-29 ENCOUNTER — Other Ambulatory Visit: Payer: Self-pay | Admitting: Family Medicine

## 2022-06-29 DIAGNOSIS — K219 Gastro-esophageal reflux disease without esophagitis: Secondary | ICD-10-CM

## 2022-07-02 ENCOUNTER — Other Ambulatory Visit: Payer: Self-pay

## 2022-07-15 ENCOUNTER — Telehealth: Payer: Self-pay | Admitting: Family Medicine

## 2022-07-15 NOTE — Telephone Encounter (Signed)
Caller Name: [facility] Call back phone #: [phone]  Reason for Call: Sent forms requesting Insulin pump and CGM over on November 3rd. Please call with an update.

## 2022-07-15 NOTE — Telephone Encounter (Signed)
Received fax and placed in providers folder in front office.

## 2022-07-17 ENCOUNTER — Telehealth: Payer: Self-pay | Admitting: Family Medicine

## 2022-07-17 ENCOUNTER — Ambulatory Visit: Payer: Self-pay | Admitting: *Deleted

## 2022-07-17 NOTE — Telephone Encounter (Signed)
Summary: med ?   Margaret Bailey from [facility] called in asking if med, simvastatin (ZOCOR) 40 MG tablet, should be the 40 mg or '20mg'$ . Please call back         Call to [facility]- advised provider/PCP is not provider we cover call for- advised PCP: Abelino Derrick, [facility] [phone] They will follow up for Rx questions with PCP for patient.

## 2022-07-17 NOTE — Telephone Encounter (Signed)
Caller Name: [facility] Call back phone #: [phone]  Reason for Call: Please call to verify dosage for patient's Simvastatin. Pharmacy received a fax to fill '20mg'$ , however most recent prescription is '40mg'$ 

## 2022-07-20 NOTE — Telephone Encounter (Signed)
Spoke with pharmacist regard '40mg'$  Zocor

## 2022-07-20 NOTE — Telephone Encounter (Signed)
Form received and placed on Providers desk to be viewed and signed.

## 2022-07-27 ENCOUNTER — Ambulatory Visit (INDEPENDENT_AMBULATORY_CARE_PROVIDER_SITE_OTHER): Payer: Medicare Other | Admitting: Family Medicine

## 2022-07-27 ENCOUNTER — Encounter: Payer: Self-pay | Admitting: Family Medicine

## 2022-07-27 VITALS — BP 130/70 | HR 92 | Temp 96.8°F | Ht 61.0 in | Wt 179.4 lb

## 2022-07-27 DIAGNOSIS — E7800 Pure hypercholesterolemia, unspecified: Secondary | ICD-10-CM

## 2022-07-27 DIAGNOSIS — E538 Deficiency of other specified B group vitamins: Secondary | ICD-10-CM

## 2022-07-27 DIAGNOSIS — I10 Essential (primary) hypertension: Secondary | ICD-10-CM | POA: Diagnosis not present

## 2022-07-27 DIAGNOSIS — E611 Iron deficiency: Secondary | ICD-10-CM

## 2022-07-27 DIAGNOSIS — Z23 Encounter for immunization: Secondary | ICD-10-CM

## 2022-07-27 DIAGNOSIS — E1165 Type 2 diabetes mellitus with hyperglycemia: Secondary | ICD-10-CM | POA: Diagnosis not present

## 2022-07-27 DIAGNOSIS — R350 Frequency of micturition: Secondary | ICD-10-CM

## 2022-07-27 LAB — URINALYSIS, ROUTINE W REFLEX MICROSCOPIC
Bilirubin Urine: NEGATIVE
Hgb urine dipstick: NEGATIVE
Ketones, ur: NEGATIVE
Leukocytes,Ua: NEGATIVE
Nitrite: NEGATIVE
RBC / HPF: NONE SEEN (ref 0–?)
Specific Gravity, Urine: 1.01 (ref 1.000–1.030)
Total Protein, Urine: NEGATIVE
Urine Glucose: >=1000 — AB
Urobilinogen, UA: 0.2 (ref 0.0–1.0)
pH: 5.5 (ref 5.0–8.0)

## 2022-07-27 LAB — BASIC METABOLIC PANEL
BUN: 18 mg/dL (ref 6–23)
CO2: 28 mEq/L (ref 19–32)
Calcium: 10.4 mg/dL (ref 8.4–10.5)
Chloride: 97 mEq/L (ref 96–112)
Creatinine, Ser: 0.94 mg/dL (ref 0.40–1.20)
GFR: 61.69 mL/min (ref >60.00)
Glucose, Bld: 163 mg/dL — ABNORMAL HIGH (ref 70–99)
Potassium: 3.9 mEq/L (ref 3.5–5.1)
Sodium: 135 mEq/L (ref 135–145)

## 2022-07-27 LAB — LDL CHOLESTEROL, DIRECT: Direct LDL: 81 mg/dL

## 2022-07-27 LAB — MICROALBUMIN / CREATININE URINE RATIO
Creatinine,U: 49.1 mg/dL
Microalb Creat Ratio: 1.4 mg/g (ref 0.0–30.0)
Microalb, Ur: <0.7 mg/dL (ref 0.0–1.9)

## 2022-07-27 LAB — HEMOGLOBIN A1C: Hgb A1c MFr Bld: 8.2 % — ABNORMAL HIGH (ref 4.6–6.5)

## 2022-07-27 LAB — VITAMIN B12: Vitamin B-12: 664 pg/mL (ref 211–911)

## 2022-07-27 MED ORDER — SIMVASTATIN 40 MG PO TABS: 40.0000 mg | ORAL_TABLET | Freq: Every day | ORAL | 3 refills | Status: DC

## 2022-07-27 MED ORDER — LISINOPRIL 5 MG PO TABS: 5.0000 mg | ORAL_TABLET | Freq: Every day | ORAL | 3 refills | Status: AC

## 2022-07-27 NOTE — Progress Notes (Signed)
Established Patient Office Visit  Subjective   Patient ID: Margaret Bailey, female    DOB: 09/02/1950  Age: 71 y.o. MRN: [id number]  Chief Complaint  Patient presents with   Follow-up    3 month follow, no concerns. Patient not fasting.     HPI follow-up of type 2 diabetes, cholesterol, B12 deficiency, iron deficiency, urinary frequency.  Denies dysuria or urgency just pees frequently.  Currently on metformin 1000 twice daily with [place] added for a months ago.  She is not on an ACE inhibitor.  She will not take iron because it constipated her.  She is taking a multivitamin with iron in it.  Continues B12 supplementation to through same multivitamin.  She is not fasting past for appointments.  We will be able to check her LDL    Review of Systems  Constitutional: Negative.   HENT: Negative.    Eyes:  Negative for blurred vision, discharge and redness.  Respiratory: Negative.    Cardiovascular: Negative.   Gastrointestinal:  Negative for abdominal pain.  Genitourinary:  Positive for frequency. Negative for dysuria, hematuria and urgency.  Musculoskeletal: Negative.  Negative for myalgias.  Skin:  Negative for rash.  Neurological:  Negative for tingling, loss of consciousness and weakness.  Endo/Heme/Allergies:  Negative for polydipsia.      Objective:     BP 130/70 (BP Location: Right Arm, Patient Position: Sitting, Cuff Size: Large)   Pulse 92   Temp (!) 96.8 F (36 C) (Temporal)   Ht '5\' 1"'$  (1.549 m)   Wt 179 lb 6.4 oz (81.4 kg)   SpO2 95%   BMI 33.90 kg/m    Physical Exam Constitutional:      General: She is not in acute distress.    Appearance: Normal appearance. She is not ill-appearing, toxic-appearing or diaphoretic.  HENT:     Head: Normocephalic and atraumatic.     Right Ear: Tympanic membrane, ear canal and external ear normal.     Left Ear: Tympanic membrane, ear canal and external ear normal.     Mouth/Throat:     Mouth: Mucous membranes are moist.      Pharynx: Oropharynx is clear. No oropharyngeal exudate or posterior oropharyngeal erythema.  Eyes:     General: No scleral icterus.       Right eye: No discharge.        Left eye: No discharge.     Extraocular Movements: Extraocular movements intact.     Conjunctiva/sclera: Conjunctivae normal.     Pupils: Pupils are equal, round, and reactive to light.  Cardiovascular:     Rate and Rhythm: Normal rate and regular rhythm.  Pulmonary:     Effort: Pulmonary effort is normal. No respiratory distress.     Breath sounds: Normal breath sounds.  Musculoskeletal:     Cervical back: No rigidity or tenderness.  Skin:    General: Skin is warm and dry.  Neurological:     Mental Status: She is alert and oriented to person, place, and time.  Psychiatric:        Mood and Affect: Mood normal.        Behavior: Behavior normal.      No results found for any visits on 07/27/22.    The 10-year ASCVD risk score (Arnett DK, et al., 2019) is: 20.6%    Assessment & Plan:   Problem List Items Addressed This Visit       Cardiovascular and Mediastinum   Essential hypertension - Primary  Relevant Medications   simvastatin (ZOCOR) 40 MG tablet   lisinopril (ZESTRIL) 5 MG tablet   Other Relevant Orders   Basic metabolic panel     Endocrine   Type 2 diabetes mellitus with hyperglycemia, without long-term current use of insulin (HCC)   Relevant Medications   simvastatin (ZOCOR) 40 MG tablet   lisinopril (ZESTRIL) 5 MG tablet   Other Relevant Orders   Hemoglobin Z6X   Basic metabolic panel   Microalbumin / creatinine urine ratio     Other   Elevated cholesterol   Relevant Medications   simvastatin (ZOCOR) 40 MG tablet   lisinopril (ZESTRIL) 5 MG tablet   Other Relevant Orders   LDL cholesterol, direct   Iron deficiency   Relevant Orders   Iron, TIBC and Ferritin Panel   B12 deficiency   Relevant Orders   Vitamin B12   Need for influenza vaccination   Relevant Orders   Flu  vaccine HIGH DOSE PF (Fluzone High dose) (Completed)   Urinary frequency   Relevant Orders   Urinalysis, Routine w reflex microscopic   Urine Culture    Return in about 6 months (around 01/25/2023).  Continue current medications as above.  Lisinopril for renal protection.  [place] likely cause of urinary frequency.  We will have patient continue it for now.  Libby Maw, MD

## 2022-07-28 LAB — IRON,TIBC AND FERRITIN PANEL
%SAT: 21 % (calc) (ref 16–45)
Ferritin: 14 ng/mL — ABNORMAL LOW (ref 16–288)
Iron: 84 ug/dL (ref 45–160)
TIBC: 395 mcg/dL (calc) (ref 250–450)

## 2022-07-28 LAB — URINE CULTURE
MICRO NUMBER:: 14233799
SPECIMEN QUALITY:: ADEQUATE

## 2022-07-28 MED ORDER — SIMVASTATIN 40 MG PO TABS: 40.0000 mg | ORAL_TABLET | Freq: Every day | ORAL | 3 refills | Status: DC

## 2022-07-28 NOTE — Addendum Note (Signed)
Addended by: Jon Billings on: 07/28/2022 08:18 AM   Modules accepted: Orders

## 2022-08-07 ENCOUNTER — Ambulatory Visit: Payer: Medicare Other | Admitting: Podiatry

## 2022-08-25 ENCOUNTER — Encounter: Payer: Self-pay | Admitting: Podiatry

## 2022-08-25 ENCOUNTER — Ambulatory Visit (INDEPENDENT_AMBULATORY_CARE_PROVIDER_SITE_OTHER): Payer: Medicare Other | Admitting: Podiatry

## 2022-08-25 DIAGNOSIS — M79675 Pain in left toe(s): Secondary | ICD-10-CM | POA: Diagnosis not present

## 2022-08-25 DIAGNOSIS — N179 Acute kidney failure, unspecified: Secondary | ICD-10-CM

## 2022-08-25 DIAGNOSIS — E1165 Type 2 diabetes mellitus with hyperglycemia: Secondary | ICD-10-CM

## 2022-08-25 DIAGNOSIS — B351 Tinea unguium: Secondary | ICD-10-CM

## 2022-08-25 DIAGNOSIS — M79674 Pain in right toe(s): Secondary | ICD-10-CM

## 2022-08-25 NOTE — Progress Notes (Signed)
This patient presents to the office with chief complaint of long thick painful nails.  Patient says the nails are painful walking and wearing shoes.  This patient is unable to self treat.  This patient is unable to trim her nails since she is unable to reach her nails.  She presents to the office for preventative foot care services.  General Appearance  Alert, conversant and in no acute stress.  Vascular  Dorsalis pedis and posterior tibial  pulses are palpable  bilaterally.  Capillary return is within normal limits  bilaterally. Temperature is within normal limits  bilaterally.  Neurologic  Senn-Weinstein monofilament wire test within normal limits  bilaterally. Muscle power within normal limits bilaterally.  Nails Thick disfigured discolored nails with subungual debris  hallux nails  bilaterally. No evidence of bacterial infection or drainage bilaterally.  Orthopedic  No limitations of motion  feet .  No crepitus or effusions noted.  No bony pathology or digital deformities noted.  Skin  normotropic skin with no porokeratosis noted bilaterally.  No signs of infections or ulcers noted.     Onychomycosis  Nails  B/L.  Pain in right toes  Pain in left toes  Debridement of nails both feet followed trimming the nails with dremel tool.    RTC 3 months.   Kaina Orengo DPM   

## 2022-08-28 ENCOUNTER — Other Ambulatory Visit: Payer: Self-pay | Admitting: Family Medicine

## 2022-09-08 ENCOUNTER — Encounter: Payer: Self-pay | Admitting: Family Medicine

## 2022-09-28 ENCOUNTER — Telehealth: Payer: Self-pay | Admitting: Family Medicine

## 2022-09-28 ENCOUNTER — Other Ambulatory Visit: Payer: Self-pay

## 2022-09-28 DIAGNOSIS — E1165 Type 2 diabetes mellitus with hyperglycemia: Secondary | ICD-10-CM

## 2022-09-28 MED ORDER — METFORMIN HCL 1000 MG PO TABS: 1000.0000 mg | ORAL_TABLET | Freq: Two times a day (BID) | ORAL | 3 refills | Status: DC

## 2022-09-28 NOTE — Telephone Encounter (Signed)
Rx pended

## 2022-09-28 NOTE — Telephone Encounter (Signed)
Caller Name: Venise Call back phone #: [phone]   MEDICATION(S):  Metformin  Days of Med Remaining: 0 has been out for awhile  Has the patient contacted their pharmacy (YES/NO)? no What did pharmacy advise?   Preferred Pharmacy:  [facility] on [address] [place]  ~~~Please advise patient/caregiver to allow 2-3 business days to process RX refills.

## 2022-09-30 ENCOUNTER — Other Ambulatory Visit: Payer: Self-pay | Admitting: Family Medicine

## 2022-10-02 ENCOUNTER — Telehealth: Payer: Self-pay | Admitting: Family Medicine

## 2022-10-02 NOTE — Telephone Encounter (Signed)
Reshell from [employer] called and said that the form we sent [employer] was incomplete and asked if it could be finished and sent again. They said everything from step 1 was missing. Callback if needed is [phone]

## 2022-10-06 NOTE — Telephone Encounter (Signed)
Form completed and faxed back

## 2022-10-08 ENCOUNTER — Ambulatory Visit (INDEPENDENT_AMBULATORY_CARE_PROVIDER_SITE_OTHER): Payer: 59 | Admitting: Family Medicine

## 2022-10-08 ENCOUNTER — Encounter: Payer: Self-pay | Admitting: Family Medicine

## 2022-10-08 VITALS — BP 116/68 | HR 81 | Temp 97.1°F | Ht 61.0 in | Wt 176.0 lb

## 2022-10-08 DIAGNOSIS — E1165 Type 2 diabetes mellitus with hyperglycemia: Secondary | ICD-10-CM

## 2022-10-08 DIAGNOSIS — D352 Benign neoplasm of pituitary gland: Secondary | ICD-10-CM

## 2022-10-08 DIAGNOSIS — R42 Dizziness and giddiness: Secondary | ICD-10-CM | POA: Diagnosis not present

## 2022-10-08 DIAGNOSIS — E7800 Pure hypercholesterolemia, unspecified: Secondary | ICD-10-CM

## 2022-10-08 DIAGNOSIS — E611 Iron deficiency: Secondary | ICD-10-CM

## 2022-10-08 DIAGNOSIS — F209 Schizophrenia, unspecified: Secondary | ICD-10-CM | POA: Diagnosis not present

## 2022-10-08 LAB — CBC
HCT: 40.4 % (ref 36.0–46.0)
Hemoglobin: 12.8 g/dL (ref 12.0–15.0)
MCHC: 31.6 g/dL (ref 30.0–36.0)
MCV: 79.7 fl (ref 78.0–100.0)
Platelets: 338 10*3/uL (ref 150.0–400.0)
RBC: 5.07 Mil/uL (ref 3.87–5.11)
RDW: 15.2 % (ref 11.5–15.5)
WBC: 8.8 10*3/uL (ref 4.0–10.5)

## 2022-10-08 LAB — URINALYSIS, ROUTINE W REFLEX MICROSCOPIC
Bilirubin Urine: NEGATIVE
Hgb urine dipstick: NEGATIVE
Ketones, ur: NEGATIVE
Leukocytes,Ua: NEGATIVE
Nitrite: NEGATIVE
RBC / HPF: NONE SEEN (ref 0–?)
Specific Gravity, Urine: 1.02 (ref 1.000–1.030)
Total Protein, Urine: NEGATIVE
Urine Glucose: >=1000 — AB
Urobilinogen, UA: 0.2 (ref 0.0–1.0)
pH: 6 (ref 5.0–8.0)

## 2022-10-08 LAB — BASIC METABOLIC PANEL
BUN: 13 mg/dL (ref 6–23)
CO2: 28 mEq/L (ref 19–32)
Calcium: 9.8 mg/dL (ref 8.4–10.5)
Chloride: 102 mEq/L (ref 96–112)
Creatinine, Ser: 0.74 mg/dL (ref 0.40–1.20)
GFR: 82.09 mL/min (ref >60.00)
Glucose, Bld: 113 mg/dL — ABNORMAL HIGH (ref 70–99)
Potassium: 4.3 mEq/L (ref 3.5–5.1)
Sodium: 138 mEq/L (ref 135–145)

## 2022-10-08 MED ORDER — HALOPERIDOL 10 MG PO TABS: 10.0000 mg | ORAL_TABLET | Freq: Every day | ORAL | 2 refills | Status: AC

## 2022-10-08 MED ORDER — IRON (FERROUS SULFATE) 325 (65 FE) MG PO TABS: 325.0000 mg | ORAL_TABLET | ORAL | 2 refills | Status: AC

## 2022-10-08 MED ORDER — FARXIGA 10 MG PO TABS: 10.0000 mg | ORAL_TABLET | Freq: Every day | ORAL | 3 refills | Status: DC

## 2022-10-08 MED ORDER — SIMVASTATIN 40 MG PO TABS: 40.0000 mg | ORAL_TABLET | Freq: Every day | ORAL | 3 refills | Status: AC

## 2022-10-08 MED ORDER — METFORMIN HCL 1000 MG PO TABS: 1000.0000 mg | ORAL_TABLET | Freq: Two times a day (BID) | ORAL | 3 refills | Status: AC

## 2022-10-08 MED ORDER — DULOXETINE HCL 60 MG PO CPEP: 60.0000 mg | ORAL_CAPSULE | Freq: Every day | ORAL | 11 refills | Status: AC

## 2022-10-08 NOTE — Progress Notes (Signed)
Established Patient Office Visit   Subjective:  Patient ID: Margaret Bailey, female    DOB: 11-18-1950  Age: 72 y.o. MRN: [id number]  Chief Complaint  Patient presents with   Follow-up    6 month follow concerns about vertigo that come and go. Patient fasting.     HPI Encounter Diagnoses  Name Primary?   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  Continues to experience dizziness that she describes as lightheadedness.  She is concerned about labyrinthitis but there is no spinning sensation.  She feels as though she is drinking plenty of water.  She has been able to lose some weight as advised.  She is exercising daily by walking.  She has not seen neurosurgery for her pituitary macroadenoma.  She has no follow-up with behavioral health.   Review of Systems  Constitutional: Negative.   HENT: Negative.    Eyes:  Negative for blurred vision, discharge and redness.  Respiratory: Negative.    Cardiovascular: Negative.   Gastrointestinal:  Negative for abdominal pain.  Genitourinary: Negative.   Musculoskeletal: Negative.  Negative for myalgias.  Skin:  Negative for rash.  Neurological:  Negative for tingling, loss of consciousness and weakness.  Endo/Heme/Allergies:  Negative for polydipsia.     Current Outpatient Medications:    BAYER ASPIRIN PO, Take 81 mg by mouth., Disp: , Rfl:    diclofenac Sodium (VOLTAREN) 1 % GEL, Apply 1 application topically 4 (four) times daily as needed (knees)., Disp: , Rfl:    diphenhydrAMINE (BENADRYL) 25 MG tablet, Take 25 mg by mouth every 6 (six) hours as needed for itching., Disp: , Rfl:    famotidine (PEPCID) 20 MG tablet, TAKE 1 TABLET BY MOUTH DAILY, Disp: 90 tablet, Rfl: 3   Iron, Ferrous Sulfate, 325 (65 Fe) MG TABS, Take  325 mg by mouth every other day., Disp: 45 tablet, Rfl: 2   LINZESS 145 MCG CAPS capsule, TAKE 1 CAPSULE BY MOUTH DAILY AS NEEDED FOR CONSTIPATION, Disp: 90 capsule, Rfl: 3   trolamine salicylate (ASPERCREME) 10 % cream, Apply 1 application topically as needed (knee pain)., Disp: , Rfl:    ACCU-CHEK GUIDE test strip, , Disp: , Rfl:    Accu-Chek Softclix Lancets lancets, SMARTSIG:Topical (Patient not taking: Reported on 10/08/2022), Disp: , Rfl:    Blood Glucose Monitoring Suppl (ACCU-CHEK GUIDE) w/Device KIT, , Disp: , Rfl:    DULoxetine (CYMBALTA) 60 MG capsule, Take 1 capsule (60 mg total) by mouth daily., Disp: 30 capsule, Rfl: 11   FARXIGA 10 MG TABS tablet, Take 1 tablet (10 mg total) by mouth daily., Disp: 30 tablet, Rfl: 3   haloperidol (HALDOL) 10 MG tablet, Take 1 tablet (10 mg total) by mouth at bedtime., Disp: 90 tablet, Rfl: 2   lisinopril (ZESTRIL) 5 MG tablet, Take 1 tablet (5 mg total) by mouth daily. (Patient not taking: Reported on 10/08/2022), Disp: 90 tablet, Rfl: 3   metFORMIN (GLUCOPHAGE) 1000 MG tablet, Take 1 tablet (1,000 mg total) by mouth 2 (two) times daily., Disp: 180 tablet, Rfl: 3   simvastatin (ZOCOR) 40 MG tablet, Take 1 tablet (40 mg total) by mouth at bedtime., Disp: 90 tablet, Rfl: 3   Objective:     BP 116/68 (BP Location: Right Arm, Patient Position:  Sitting, Cuff Size: Normal)   Pulse 81   Temp (!) 97.1 F (36.2 C) (Temporal)   Ht '5\' 1"'$  (1.549 m)   Wt 176 lb (79.8 kg)   SpO2 93%   BMI 33.25 kg/m  BP Readings from Last 3 Encounters:  10/08/22 116/68  07/27/22 130/70  04/28/22 132/70   Wt Readings from Last 3 Encounters:  10/08/22 176 lb (79.8 kg)  07/27/22 179 lb 6.4 oz (81.4 kg)  04/28/22 186 lb 9.6 oz (84.6 kg)      Physical Exam Constitutional:      General: She is not in acute distress.    Appearance: Normal appearance. She is not ill-appearing, toxic-appearing or diaphoretic.  HENT:     Head: Normocephalic and atraumatic.     Right Ear:  Tympanic membrane, ear canal and external ear normal.     Left Ear: Tympanic membrane, ear canal and external ear normal.     Mouth/Throat:     Mouth: Mucous membranes are moist.     Pharynx: Oropharynx is clear. No oropharyngeal exudate or posterior oropharyngeal erythema.  Eyes:     General: No scleral icterus.       Right eye: No discharge.        Left eye: No discharge.     Extraocular Movements: Extraocular movements intact.     Conjunctiva/sclera: Conjunctivae normal.     Pupils: Pupils are equal, round, and reactive to light.  Cardiovascular:     Rate and Rhythm: Normal rate and regular rhythm.  Pulmonary:     Effort: Pulmonary effort is normal. No respiratory distress.     Breath sounds: Normal breath sounds.  Musculoskeletal:     Cervical back: No rigidity or tenderness.  Skin:    General: Skin is warm and dry.  Neurological:     Mental Status: She is alert and oriented to person, place, and time.  Psychiatric:        Mood and Affect: Mood normal.        Behavior: Behavior normal.      No results found for any visits on 10/08/22.    The 10-year ASCVD risk score (Arnett DK, et al., 2019) is: 16.9%    Assessment & Plan:   Pituitary macroadenoma ([place]) -     Ambulatory referral to Neurosurgery  Type 2 diabetes mellitus with hyperglycemia, without long-term current use of insulin (HCC) -     metFORMIN HCl; Take 1 tablet (1,000 mg total) by mouth 2 (two) times daily.  Dispense: 180 tablet; Refill: 3 -     Farxiga; Take 1 tablet (10 mg total) by mouth daily.  Dispense: 30 tablet; Refill: 3  Lightheadedness -     Basic metabolic panel -     Urinalysis, Routine w reflex microscopic -     CBC  Schizophrenia, unspecified type (HCC) -     DULoxetine HCl; Take 1 capsule (60 mg total) by mouth daily.  Dispense: 30 capsule; Refill: 11 -     Ambulatory referral to Psychiatry -     Haloperidol; Take 1 tablet (10 mg total) by mouth at bedtime.  Dispense: 90 tablet;  Refill: 2  Iron deficiency -     Iron (Ferrous Sulfate); Take 325 mg by mouth every other day.  Dispense: 45 tablet; Refill: 2  Elevated cholesterol -     Simvastatin; Take 1 tablet (40 mg total) by mouth at bedtime.  Dispense: 90 tablet; Refill: 3    Return in about 3 months (  around 01/06/2023).   Refilling above medicines.  Libby Maw, MD

## 2022-10-09 ENCOUNTER — Telehealth: Payer: Self-pay | Admitting: Family Medicine

## 2022-10-09 NOTE — Telephone Encounter (Signed)
Caller Name: Manuela Schwartz from [facility] Call back phone #: [phone]  Reason for Call: Dr. Ethelene Hal put in a referral for this Pt yesterday and they need some clarification before she can schedule.

## 2022-10-09 NOTE — Telephone Encounter (Signed)
Spoke with Manuela Schwartz at [facility] who states that patient was seen back in June 2023 (she have faxed over notes) per Dr. Marcello Moores recommendations patient to come back in one year from June to have repeat MRI. Manuela Schwartz calling asking if new referral was for something different than last referral did they need to schedule patient or hold until June?

## 2022-10-09 NOTE — Telephone Encounter (Signed)
Returned Margaret Bailey's call no answer LMTCB

## 2022-11-24 ENCOUNTER — Ambulatory Visit (INDEPENDENT_AMBULATORY_CARE_PROVIDER_SITE_OTHER): Payer: 59 | Admitting: Podiatry

## 2022-11-24 ENCOUNTER — Encounter: Payer: Self-pay | Admitting: Podiatry

## 2022-11-24 DIAGNOSIS — M79674 Pain in right toe(s): Secondary | ICD-10-CM

## 2022-11-24 DIAGNOSIS — B351 Tinea unguium: Secondary | ICD-10-CM | POA: Diagnosis not present

## 2022-11-24 DIAGNOSIS — M79675 Pain in left toe(s): Secondary | ICD-10-CM

## 2022-11-24 DIAGNOSIS — E1165 Type 2 diabetes mellitus with hyperglycemia: Secondary | ICD-10-CM

## 2022-11-24 NOTE — Progress Notes (Signed)
This patient presents to the office with chief complaint of long thick painful nails.  Patient says the nails are painful walking and wearing shoes.  This patient is unable to self treat.  This patient is unable to trim her nails since she is unable to reach her nails.  She presents to the office for preventative foot care services. ? ?General Appearance  Alert, conversant and in no acute stress. ? ?Vascular  Dorsalis pedis and posterior tibial  pulses are palpable  bilaterally.  Capillary return is within normal limits  bilaterally. Temperature is within normal limits  bilaterally. ? ?Neurologic  Senn-Weinstein monofilament wire test within normal limits  bilaterally. Muscle power within normal limits bilaterally. ? ?Nails Thick disfigured discolored nails with subungual debris  hallux nails  bilaterally. No evidence of bacterial infection or drainage bilaterally. ? ?Orthopedic  No limitations of motion  feet .  No crepitus or effusions noted.  No bony pathology or digital deformities noted. ? ?Skin  normotropic skin with no porokeratosis noted bilaterally.  No signs of infections or ulcers noted.    ? ?Onychomycosis  Nails  B/L.  Pain in right toes  Pain in left toes ? ?Debridement of nails both feet followed trimming the nails with dremel tool.   RTC 3 months. ? ? ?Rawleigh Rode DPM   ?

## 2022-11-30 ENCOUNTER — Ambulatory Visit (INDEPENDENT_AMBULATORY_CARE_PROVIDER_SITE_OTHER): Payer: 59

## 2022-11-30 VITALS — Ht 61.0 in | Wt 177.0 lb

## 2022-11-30 DIAGNOSIS — Z0000 Encounter for general adult medical examination without abnormal findings: Secondary | ICD-10-CM | POA: Diagnosis not present

## 2022-11-30 NOTE — Progress Notes (Signed)
I connected with  Colletta Maryland on 11/30/22 by a audio enabled telemedicine application and verified that I am speaking with the correct person using two identifiers.  Patient Location: Home  Provider Location: Office/Clinic  I discussed the limitations of evaluation and management by telemedicine. The patient expressed understanding and agreed to proceed.  Subjective:   Margaret Bailey is a 72 y.o. female who presents for Medicare Annual (Subsequent) preventive examination.  Review of Systems     Cardiac Risk Factors include: advanced age (>31men, >76 women);diabetes mellitus;hypertension;obesity (BMI >30kg/m2)     Objective:    Today's Vitals   11/30/22 1041  Weight: 177 lb (80.3 kg)  Height: 5\' 1"  (1.549 m)   Body mass index is 33.44 kg/m.     11/30/2022   10:49 AM 11/26/2021   11:23 AM  Advanced Directives  Does Patient Have a Medical Advance Directive? No Yes  Type of Advance Directive  Living will;Healthcare Power of [place] in Chart?  No - copy requested    Current Medications (verified) Outpatient Encounter Medications as of 11/30/2022  Medication Sig   BAYER ASPIRIN PO Take 81 mg by mouth.   diclofenac Sodium (VOLTAREN) 1 % GEL Apply 1 application topically 4 (four) times daily as needed (knees).   diphenhydrAMINE (BENADRYL) 25 MG tablet Take 25 mg by mouth every 6 (six) hours as needed for itching.   DULoxetine (CYMBALTA) 60 MG capsule Take 1 capsule (60 mg total) by mouth daily.   famotidine (PEPCID) 20 MG tablet TAKE 1 TABLET BY MOUTH DAILY   FARXIGA 10 MG TABS tablet Take 1 tablet (10 mg total) by mouth daily.   haloperidol (HALDOL) 10 MG tablet Take 1 tablet (10 mg total) by mouth at bedtime.   LINZESS 145 MCG CAPS capsule TAKE 1 CAPSULE BY MOUTH DAILY AS NEEDED FOR CONSTIPATION   metFORMIN (GLUCOPHAGE) 1000 MG tablet Take 1 tablet (1,000 mg total) by mouth 2 (two) times daily.   trolamine salicylate (ASPERCREME) 10 %  cream Apply 1 application topically as needed (knee pain).   ACCU-CHEK GUIDE test strip  (Patient not taking: Reported on 10/08/2022)   Accu-Chek Softclix Lancets lancets SMARTSIG:Topical (Patient not taking: Reported on 10/08/2022)   Blood Glucose Monitoring Suppl (ACCU-CHEK GUIDE) w/Device KIT  (Patient not taking: Reported on 10/08/2022)   Iron, Ferrous Sulfate, 325 (65 Fe) MG TABS Take 325 mg by mouth every other day. (Patient not taking: Reported on 11/30/2022)   lisinopril (ZESTRIL) 5 MG tablet Take 1 tablet (5 mg total) by mouth daily. (Patient not taking: Reported on 10/08/2022)   simvastatin (ZOCOR) 40 MG tablet Take 1 tablet (40 mg total) by mouth at bedtime. (Patient not taking: Reported on 11/30/2022)   No facility-administered encounter medications on file as of 11/30/2022.    Allergies (verified) Lemon oil   History: Past Medical History:  Diagnosis Date   Diabetes mellitus without complication    Hearing loss    Past Surgical History:  Procedure Laterality Date   CESAREAN SECTION     History reviewed. No pertinent family history. Social History   Socioeconomic History   Marital status: Single    Spouse name: Not on file   Number of children: Not on file   Years of education: Not on file   Highest education level: Not on file  Occupational History   Not on file  Tobacco Use   Smoking status: Former    Types: Cigarettes    Quit  date: 08/31/1998    Years since quitting: 24.2   Smokeless tobacco: Never  Vaping Use   Vaping Use: Never used  Substance and Sexual Activity   Alcohol use: Not Currently    Alcohol/week: 1.0 standard drink of alcohol    Types: 1 Glasses of wine per week    Comment: rare once a year   Drug use: Never   Sexual activity: Not Currently  Other Topics Concern   Not on file  Social History Narrative   Not on file   Social Determinants of Health   Financial Resource Strain: Low Risk  (11/30/2022)   Overall Financial Resource Strain (CARDIA)     Difficulty of Paying Living Expenses: Not hard at all  Food Insecurity: No Food Insecurity (11/30/2022)   Hunger Vital Sign    Worried About Running Out of Food in the Last Year: Never true    Ran Out of Food in the Last Year: Never true  Transportation Needs: No Transportation Needs (11/30/2022)   PRAPARE - Hydrologist (Medical): No    Lack of Transportation (Non-Medical): No  Physical Activity: Inactive (11/30/2022)   Exercise Vital Sign    Days of Exercise per Week: 0 days    Minutes of Exercise per Session: 0 min  Stress: No Stress Concern Present (11/30/2022)   [place]    Feeling of Stress : Not at all  Social Connections: Moderately Isolated (11/26/2021)   Social Connection and Isolation Panel [NHANES]    Frequency of Communication with Friends and Family: Three times a week    Frequency of Social Gatherings with Friends and Family: Three times a week    Attends Religious Services: More than 4 times per year    Active Member of Clubs or Organizations: No    Attends Archivist Meetings: Never    Marital Status: Never married    Tobacco Counseling Counseling given: Not Answered   Clinical Intake:  Pre-visit preparation completed: Yes  Pain : No/denies pain     Nutritional Status: BMI > 30  Obese Nutritional Risks: None Diabetes: Yes  How often do you need to have someone help you when you read instructions, pamphlets, or other written materials from your doctor or pharmacy?: 1 - Never  Diabetic? Yes Nutrition Risk Assessment:  Has the patient had any N/V/D within the last 2 months?  No  Does the patient have any non-healing wounds?  No  Has the patient had any unintentional weight loss or weight gain?  No   Diabetes:  Is the patient diabetic?  Yes  If diabetic, was a CBG obtained today?  No  Did the patient bring in their glucometer from home?  No  How often  do you monitor your CBG's? Does not.   Financial Strains and Diabetes Management:  Are you having any financial strains with the device, your supplies or your medication? No .  Does the patient want to be seen by Chronic Care Management for management of their diabetes?  No  Would the patient like to be referred to a Nutritionist or for Diabetic Management?  No   Diabetic Exams:  Diabetic Eye Exam: Completed 01/09/2022 Diabetic Foot Exam: Completed 04/28/2022   Interpreter Needed?: No  Information entered by :: NAllen LPN   Activities of Daily Living    11/30/2022   10:51 AM  In your present state of health, do you have any difficulty performing  the following activities:  Hearing? 1  Comment has hearing aids  Vision? 1  Difficulty concentrating or making decisions? 1  Walking or climbing stairs? 1  Comment due to knees  Dressing or bathing? 0  Doing errands, shopping? 0  Preparing Food and eating ? N  Using the Toilet? N  In the past six months, have you accidently leaked urine? N  Do you have problems with loss of bowel control? N  Managing your Medications? N  Managing your Finances? N  Housekeeping or managing your Housekeeping? N    Patient Care Team: Libby Maw, MD as PCP - General (Family Medicine)  Indicate any recent Medical Services you may have received from other than [facility] providers in the past year (date may be approximate).     Assessment:   This is a routine wellness examination for Floriana.  Hearing/Vision screen Vision Screening - Comments:: Regular eye exams,   Dietary issues and exercise activities discussed: Current Exercise Habits: The patient does not participate in regular exercise at present   Goals Addressed             This Visit's Progress    Patient Stated       11/30/2022, wants to lose more weight and eat healthy       Depression Screen    11/30/2022   10:51 AM 10/08/2022   10:44 AM 07/27/2022    8:16 AM  04/28/2022    8:01 AM 01/23/2022    1:43 PM 11/26/2021   11:24 AM 11/26/2021   11:21 AM  PHQ 2/9 Scores  PHQ - 2 Score 0 0 0 0 0 0 0    Fall Risk    11/30/2022   10:50 AM 10/08/2022   10:44 AM 07/27/2022    8:17 AM 04/28/2022    8:01 AM 01/23/2022    1:43 PM  Fall Risk   Falls in the past year? 1 1 0 0 0  Comment had vertigo      Number falls in past yr: 0 1 0 0 0  Injury with Fall? 0 0  0   Risk for fall due to : Medication side effect History of fall(s)  No Fall Risks   Follow up Falls prevention discussed;Education provided;Falls evaluation completed Falls evaluation completed  Falls evaluation completed     FALL RISK PREVENTION PERTAINING TO THE HOME:  Any stairs in or around the home? Yes  If so, are there any without handrails? Yes  Home free of loose throw rugs in walkways, pet beds, electrical cords, etc? Yes  Adequate lighting in your home to reduce risk of falls? Yes   ASSISTIVE DEVICES UTILIZED TO PREVENT FALLS:  Life alert? No  Use of a cane, walker or w/c? No  Grab bars in the bathroom? Yes  Shower chair or bench in shower? No  Elevated toilet seat or a handicapped toilet? No   TIMED UP AND GO:  Was the test performed? No .      Cognitive Function:        11/30/2022   10:53 AM  6CIT Screen  What Year? 0 points  What month? 0 points  What time? 3 points  Count back from 20 0 points  Months in reverse 4 points  Repeat phrase 6 points  Total Score 13 points    Immunizations Immunization History  Administered Date(s) Administered   Influenza, High Dose Seasonal PF 07/27/2022   Influenza-Unspecified 06/22/2021   Moderna Sars-Covid-2 Vaccination 10/22/2019, 11/19/2019,  07/10/2020    TDAP status: Due, Education has been provided regarding the importance of this vaccine. Advised may receive this vaccine at local pharmacy or Health Dept. Aware to provide a copy of the vaccination record if obtained from local pharmacy or Health Dept. Verbalized  acceptance and understanding.  Flu Vaccine status: Up to date  Pneumococcal vaccine status: Up to date  Covid-19 vaccine status: Completed vaccines  Qualifies for Shingles Vaccine? No   Zostavax completed Yes   Shingrix Completed?: No.    Education has been provided regarding the importance of this vaccine. Patient has been advised to call insurance company to determine out of pocket expense if they have not yet received this vaccine. Advised may also receive vaccine at local pharmacy or Health Dept. Verbalized acceptance and understanding.  Screening Tests Health Maintenance  Topic Date Due   Zoster Vaccines- Shingrix (1 of 2) Never done   DEXA SCAN  Never done   Medicare Annual Wellness (AWV)  12/26/2022   OPHTHALMOLOGY EXAM  01/10/2023   HEMOGLOBIN A1C  01/25/2023   INFLUENZA VACCINE  04/01/2023   FOOT EXAM  04/29/2023   Diabetic kidney evaluation - Urine ACR  07/28/2023   Diabetic kidney evaluation - eGFR measurement  10/09/2023   COLONOSCOPY (Pts 45-73yrs Insurance coverage will need to be confirmed)  09/10/2030   Pneumonia Vaccine 13+ Years old  Completed   Hepatitis C Screening  Completed   HPV VACCINES  Aged Out   DTaP/Tdap/Td  Discontinued   MAMMOGRAM  Discontinued   COVID-19 Vaccine  Discontinued    Health Maintenance  Health Maintenance Due  Topic Date Due   Zoster Vaccines- Shingrix (1 of 2) Never done   DEXA SCAN  Never done   Medicare Annual Wellness (AWV)  12/26/2022    Colorectal cancer screening: Type of screening: Colonoscopy. Completed 09/10/2020. Repeat every 10 years  Mammogram status: No longer required due to decline.  Bone Density status: due  Lung Cancer Screening: (Low Dose CT Chest recommended if Age 10-80 years, 30 pack-year currently smoking OR have quit w/in 15years.) does not qualify.   Lung Cancer Screening Referral: no  Additional Screening:  Hepatitis C Screening: does qualify; Completed 04/28/2022  Vision Screening: Recommended  annual ophthalmology exams for early detection of glaucoma and other disorders of the eye. Is the patient up to date with their annual eye exam?  Yes  Who is the provider or what is the name of the office in which the patient attends annual eye exams? Can't remember name If pt is not established with a provider, would they like to be referred to a provider to establish care? No .   Dental Screening: Recommended annual dental exams for proper oral hygiene  Community Resource Referral / Chronic Care Management: CRR required this visit?  No   CCM required this visit?  No      Plan:     I have personally reviewed and noted the following in the patient's chart:   Medical and social history Use of alcohol, tobacco or illicit drugs  Current medications and supplements including opioid prescriptions. Patient is not currently taking opioid prescriptions. Functional ability and status Nutritional status Physical activity Advanced directives List of other physicians Hospitalizations, surgeries, and ER visits in previous 12 months Vitals Screenings to include cognitive, depression, and falls Referrals and appointments  In addition, I have reviewed and discussed with patient certain preventive protocols, quality metrics, and best practice recommendations. A written personalized care plan for preventive  services as well as general preventive health recommendations were provided to patient.     Kellie Simmering, LPN   [id number]   Nurse Notes: none  Due to this being a virtual visit, the after visit summary with patients personalized plan was offered to patient via mail or my-chart.  to pick up at office at next visit

## 2022-11-30 NOTE — Patient Instructions (Signed)
Ms. Margaret Bailey , Thank you for taking time to come for your Medicare Wellness Visit. I appreciate your ongoing commitment to your health goals. Please review the following plan we discussed and let me know if I can assist you in the future.   These are the goals we discussed:  Goals      Patient Stated     11/30/2022, wants to lose more weight and eat healthy        This is a list of the screening recommended for you and due dates:  Health Maintenance  Topic Date Due   Zoster (Shingles) Vaccine (1 of 2) Never done   DEXA scan (bone density measurement)  Never done   Eye exam for diabetics  01/10/2023   Hemoglobin A1C  01/25/2023   Flu Shot  04/01/2023   Complete foot exam   04/29/2023   Yearly kidney health urinalysis for diabetes  07/28/2023   Yearly kidney function blood test for diabetes  10/09/2023   Medicare Annual Wellness Visit  11/30/2023   Colon Cancer Screening  09/10/2030   Pneumonia Vaccine  Completed   Hepatitis C Screening: USPSTF Recommendation to screen - Ages 24-79 yo.  Completed   HPV Vaccine  Aged Out   DTaP/Tdap/Td vaccine  Discontinued   Mammogram  Discontinued   COVID-19 Vaccine  Discontinued    Advanced directives: Advance directive discussed with you today.   Conditions/risks identified: none  Next appointment: Follow up in one year for your annual wellness visit    Preventive Care 65 Years and Older, Female Preventive care refers to lifestyle choices and visits with your health care provider that can promote health and wellness. What does preventive care include? A yearly physical exam. This is also called an annual well check. Dental exams once or twice a year. Routine eye exams. Ask your health care provider how often you should have your eyes checked. Personal lifestyle choices, including: Daily care of your teeth and gums. Regular physical activity. Eating a healthy diet. Avoiding tobacco and drug use. Limiting alcohol use. Practicing safe  sex. Taking low-dose aspirin every day. Taking vitamin and mineral supplements as recommended by your health care provider. What happens during an annual well check? The services and screenings done by your health care provider during your annual well check will depend on your age, overall health, lifestyle risk factors, and family history of disease. Counseling  Your health care provider may ask you questions about your: Alcohol use. Tobacco use. Drug use. Emotional well-being. Home and relationship well-being. Sexual activity. Eating habits. History of falls. Memory and ability to understand (cognition). Work and work Statistician. Reproductive health. Screening  You may have the following tests or measurements: Height, weight, and BMI. Blood pressure. Lipid and cholesterol levels. These may be checked every 5 years, or more frequently if you are over 30 years old. Skin check. Lung cancer screening. You may have this screening every year starting at age 35 if you have a 30-pack-year history of smoking and currently smoke or have quit within the past 15 years. Fecal occult blood test (FOBT) of the stool. You may have this test every year starting at age 18. Flexible sigmoidoscopy or colonoscopy. You may have a sigmoidoscopy every 5 years or a colonoscopy every 10 years starting at age 49. Hepatitis C blood test. Hepatitis B blood test. Sexually transmitted disease (STD) testing. Diabetes screening. This is done by checking your blood sugar (glucose) after you have not eaten for a while (fasting).  You may have this done every 1-3 years. Bone density scan. This is done to screen for osteoporosis. You may have this done starting at age 48. Mammogram. This may be done every 1-2 years. Talk to your health care provider about how often you should have regular mammograms. Talk with your health care provider about your test results, treatment options, and if necessary, the need for more  tests. Vaccines  Your health care provider may recommend certain vaccines, such as: Influenza vaccine. This is recommended every year. Tetanus, diphtheria, and acellular pertussis (Tdap, Td) vaccine. You may need a Td booster every 10 years. Zoster vaccine. You may need this after age 10. Pneumococcal 13-valent conjugate (PCV13) vaccine. One dose is recommended after age 40. Pneumococcal polysaccharide (PPSV23) vaccine. One dose is recommended after age 8. Talk to your health care provider about which screenings and vaccines you need and how often you need them. This information is not intended to replace advice given to you by your health care provider. Make sure you discuss any questions you have with your health care provider. Document Released: 09/13/2015 Document Revised: 05/06/2016 Document Reviewed: 06/18/2015 Elsevier Interactive Patient Education  2017 [place] Prevention in the Home Falls can cause injuries. They can happen to people of all ages. There are many things you can do to make your home safe and to help prevent falls. What can I do on the outside of my home? Regularly fix the edges of walkways and driveways and fix any cracks. Remove anything that might make you trip as you walk through a door, such as a raised step or threshold. Trim any bushes or trees on the path to your home. Use bright outdoor lighting. Clear any walking paths of anything that might make someone trip, such as rocks or tools. Regularly check to see if handrails are loose or broken. Make sure that both sides of any steps have handrails. Any raised decks and porches should have guardrails on the edges. Have any leaves, snow, or ice cleared regularly. Use sand or salt on walking paths during winter. Clean up any spills in your garage right away. This includes oil or grease spills. What can I do in the bathroom? Use night lights. Install grab bars by the toilet and in the tub and shower.  Do not use towel bars as grab bars. Use non-skid mats or decals in the tub or shower. If you need to sit down in the shower, use a plastic, non-slip stool. Keep the floor dry. Clean up any water that spills on the floor as soon as it happens. Remove soap buildup in the tub or shower regularly. Attach bath mats securely with double-sided non-slip rug tape. Do not have throw rugs and other things on the floor that can make you trip. What can I do in the bedroom? Use night lights. Make sure that you have a light by your bed that is easy to reach. Do not use any sheets or blankets that are too big for your bed. They should not hang down onto the floor. Have a firm chair that has side arms. You can use this for support while you get dressed. Do not have throw rugs and other things on the floor that can make you trip. What can I do in the kitchen? Clean up any spills right away. Avoid walking on wet floors. Keep items that you use a lot in easy-to-reach places. If you need to reach something above you, use a  strong step stool that has a grab bar. Keep electrical cords out of the way. Do not use floor polish or wax that makes floors slippery. If you must use wax, use non-skid floor wax. Do not have throw rugs and other things on the floor that can make you trip. What can I do with my stairs? Do not leave any items on the stairs. Make sure that there are handrails on both sides of the stairs and use them. Fix handrails that are broken or loose. Make sure that handrails are as long as the stairways. Check any carpeting to make sure that it is firmly attached to the stairs. Fix any carpet that is loose or worn. Avoid having throw rugs at the top or bottom of the stairs. If you do have throw rugs, attach them to the floor with carpet tape. Make sure that you have a light switch at the top of the stairs and the bottom of the stairs. If you do not have them, ask someone to add them for you. What else  can I do to help prevent falls? Wear shoes that: Do not have high heels. Have rubber bottoms. Are comfortable and fit you well. Are closed at the toe. Do not wear sandals. If you use a stepladder: Make sure that it is fully opened. Do not climb a closed stepladder. Make sure that both sides of the stepladder are locked into place. Ask someone to hold it for you, if possible. Clearly mark and make sure that you can see: Any grab bars or handrails. First and last steps. Where the edge of each step is. Use tools that help you move around (mobility aids) if they are needed. These include: Canes. Walkers. Scooters. Crutches. Turn on the lights when you go into a dark area. Replace any light bulbs as soon as they burn out. Set up your furniture so you have a clear path. Avoid moving your furniture around. If any of your floors are uneven, fix them. If there are any pets around you, be aware of where they are. Review your medicines with your doctor. Some medicines can make you feel dizzy. This can increase your chance of falling. Ask your doctor what other things that you can do to help prevent falls. This information is not intended to replace advice given to you by your health care provider. Make sure you discuss any questions you have with your health care provider. Document Released: 06/13/2009 Document Revised: 01/23/2016 Document Reviewed: 09/21/2014 Elsevier Interactive Patient Education  2017 [employer].

## 2023-01-04 ENCOUNTER — Other Ambulatory Visit: Payer: Self-pay | Admitting: Family Medicine

## 2023-01-04 DIAGNOSIS — E1165 Type 2 diabetes mellitus with hyperglycemia: Secondary | ICD-10-CM

## 2023-01-14 ENCOUNTER — Other Ambulatory Visit: Payer: Self-pay | Admitting: Nurse Practitioner

## 2023-01-14 DIAGNOSIS — Z1231 Encounter for screening mammogram for malignant neoplasm of breast: Secondary | ICD-10-CM

## 2023-01-20 ENCOUNTER — Ambulatory Visit
Admission: RE | Admit: 2023-01-20 | Discharge: 2023-01-20 | Disposition: A | Discharge status: Home / Self Care | Payer: 59 | Source: Ambulatory Visit | Attending: Nurse Practitioner | Admitting: Nurse Practitioner

## 2023-01-20 ENCOUNTER — Telehealth: Payer: Self-pay | Admitting: Family Medicine

## 2023-01-20 DIAGNOSIS — Z1231 Encounter for screening mammogram for malignant neoplasm of breast: Secondary | ICD-10-CM

## 2023-01-20 NOTE — Telephone Encounter (Signed)
Pt stated that she will not be coming back to our office she has found another provider. I did not remove Dr Doreene Burke.

## 2023-01-20 NOTE — Telephone Encounter (Signed)
Letter sent via  mychart notifying pt Dr. Doreene Burke was removed as PCP and future appts with our office were cancelled.

## 2023-01-22 ENCOUNTER — Other Ambulatory Visit: Payer: Self-pay | Admitting: Nurse Practitioner

## 2023-01-22 DIAGNOSIS — R928 Other abnormal and inconclusive findings on diagnostic imaging of breast: Secondary | ICD-10-CM

## 2023-01-26 ENCOUNTER — Ambulatory Visit: Payer: Medicare Other | Admitting: Family Medicine

## 2023-01-29 IMAGING — MR MR HEAD WO/W CM
13 of 22 series · 33 of 48 positions shown · IV contrast (multihance)
Comparison: MRI 06/19/2021.

CLINICAL DATA: Brain/CNS neoplasm, monitor

EXAM:
MRI HEAD WITHOUT AND WITH CONTRAST
TECHNIQUE: Multiplanar, multiecho pulse sequences of the brain and surrounding
structures were obtained without and with intravenous contrast.
CONTRAST:  9mL MULTIHANCE GADOBENATE DIMEGLUMINE 529 MG/ML IV SOLN

[Series 2: T1 · sagittal · 5.0mm · 0.45mm/px · 1 of 21 slices shown (1 of 3)]
[im 1/21]
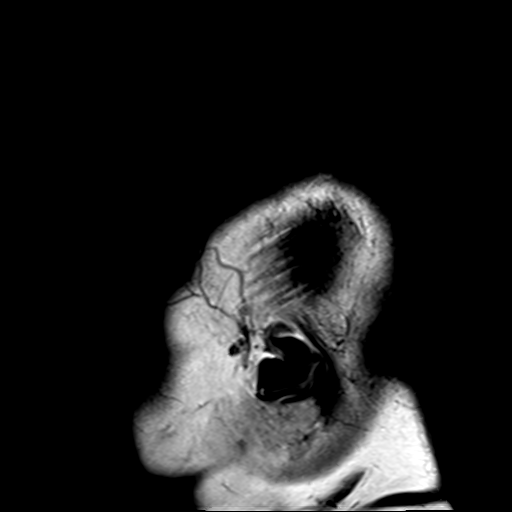

[Series 3: DWI · axial · 3.0mm · 1.80mm/px · z∈[-70,+64]mm · 9 of 90 slices shown]
[im 1/90]
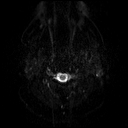
[im 12/90]
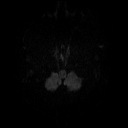
[im 23/90]
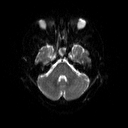
[im 34/90]
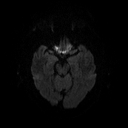
[im 45/90]
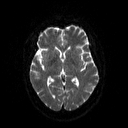
[im 56/90]
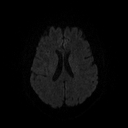
[im 67/90]
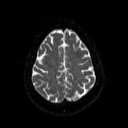
[im 78/90]
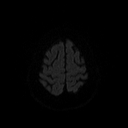
[im 90/90]
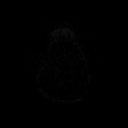

[Series 4: dwi_adc · axial · 3.0mm · 1.80mm/px · z∈[-70,+64]mm · 5 of 45 slices shown]
[im 1/45]
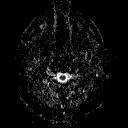
[im 12/45]
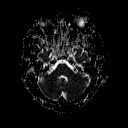
[im 23/45]
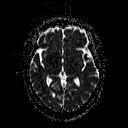
[im 34/45]
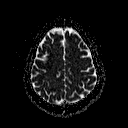
[im 45/45]
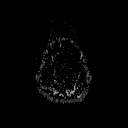

[Series 5: T2 · axial · 5.0mm · 0.36mm/px · z∈[-86,+63]mm · 2 of 24 slices shown (1 of 3)]
[im 1/24]
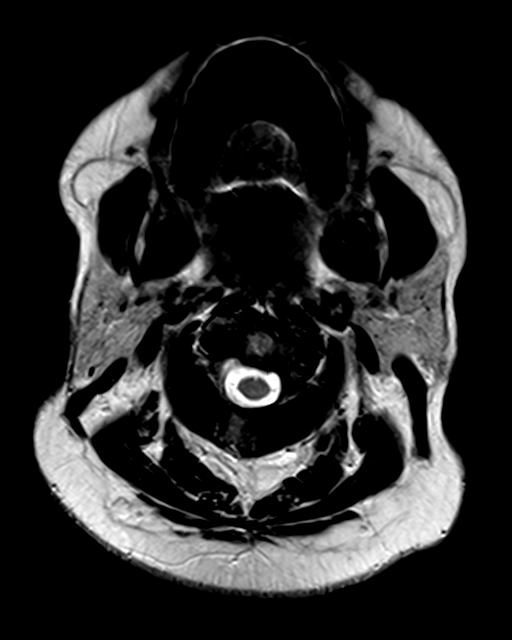
[im 24/24]
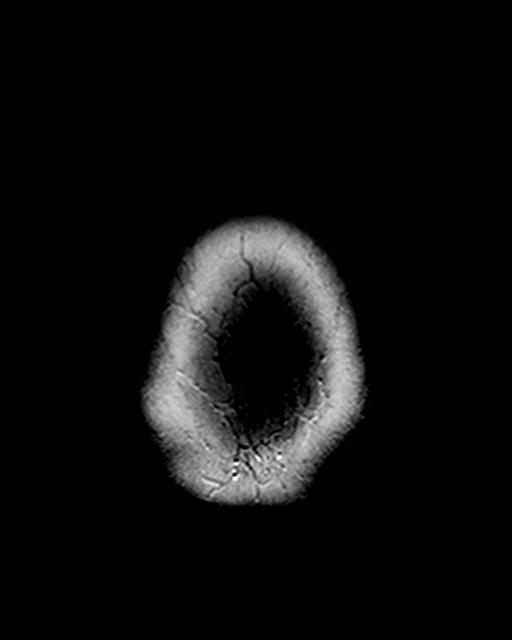

[Series 6: FLAIR · axial · 3.0mm · 0.45mm/px · z∈[-82,+62]mm · 3 of 32 slices shown]
[im 1/32]
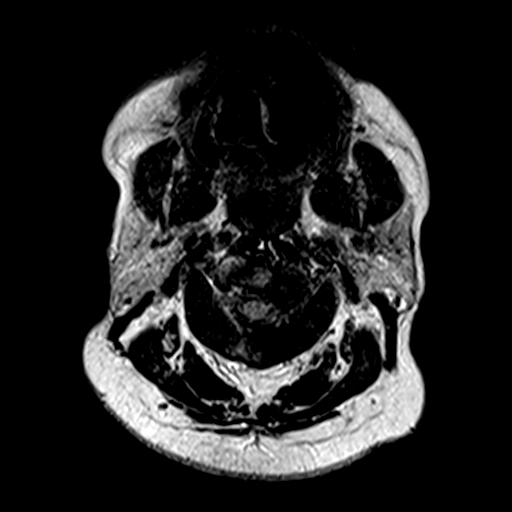
[im 16/32]
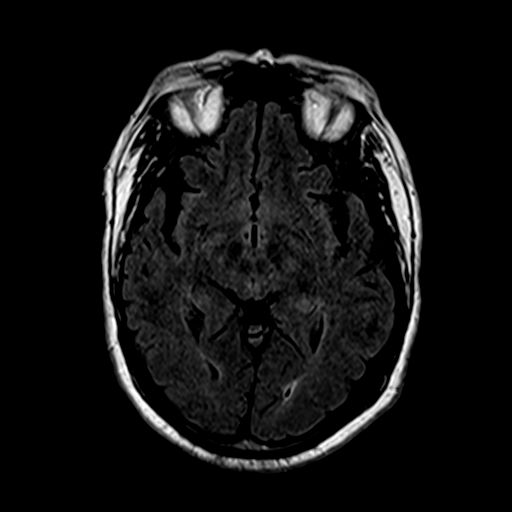
[im 32/32]
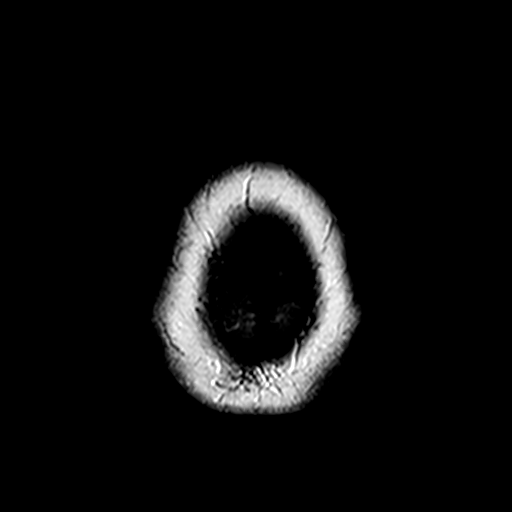

[Series 8: swi_images · axial · 4.0mm · 0.94mm/px · z∈[-79,+60]mm · 4 of 36 slices shown]
[im 1/36]
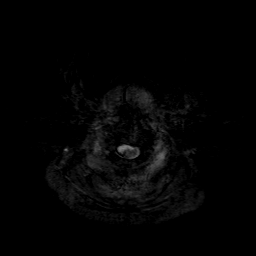
[im 12/36]
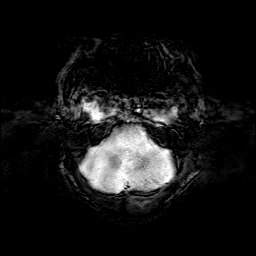
[im 24/36]
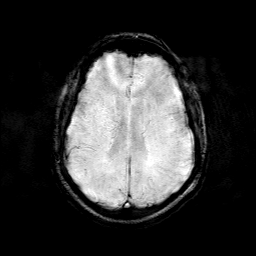
[im 36/36]
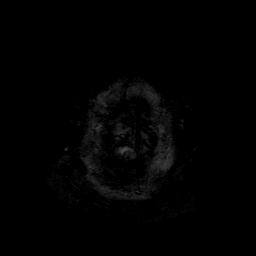

[Series 9: T1 · sagittal · 3.0mm · 0.33mm/px · 1 of 11 slices shown (2 of 3)]
[im 1/11]
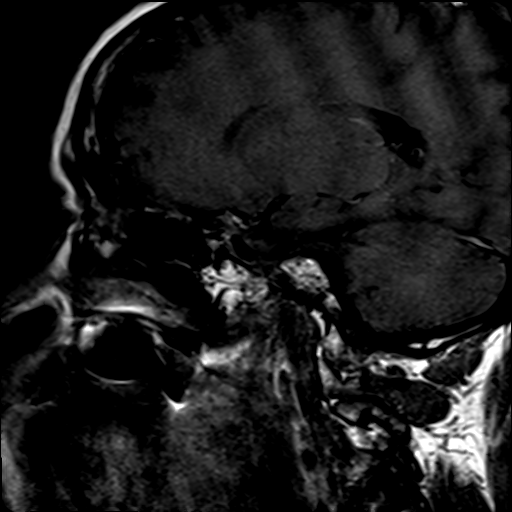

[Series 10: T1 · coronal · 3.0mm · 0.33mm/px · 1 of 11 slices shown (3 of 3)]
[im 1/11]
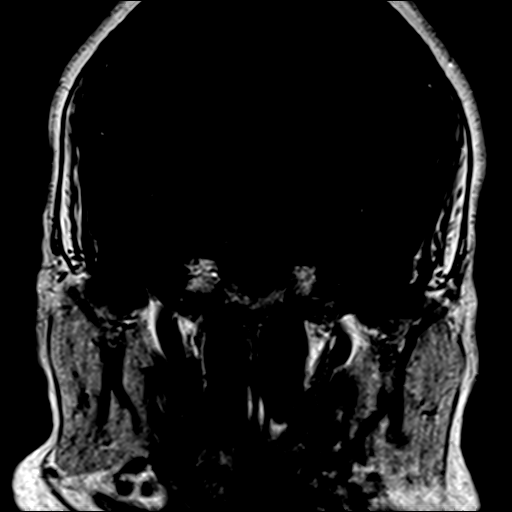

[Series 11: T2 · coronal · 3.0mm · 0.23mm/px · 1 of 11 slices shown (2 of 3)]
[im 1/11]
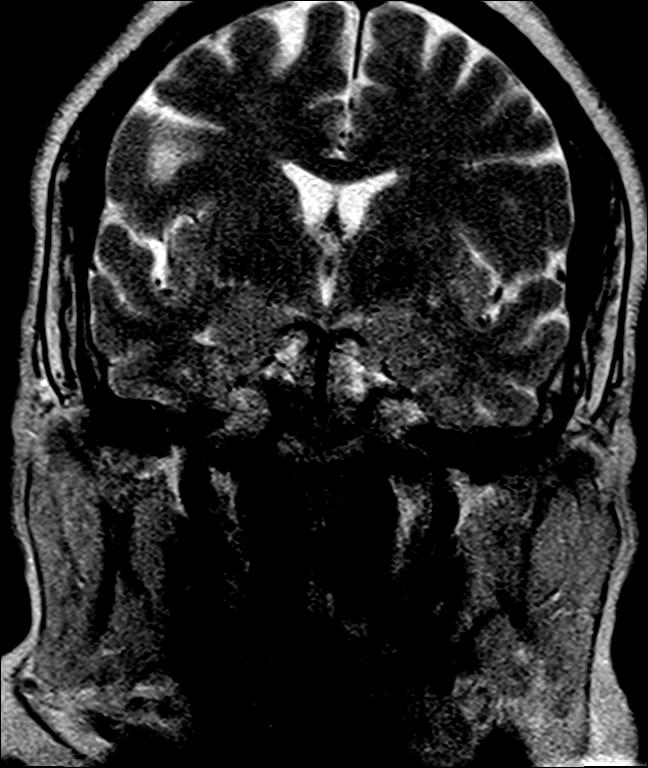

[Series 13: T2 · coronal · 3.0mm · 0.23mm/px · 1 of 11 slices shown (3 of 3)]
[im 1/11]
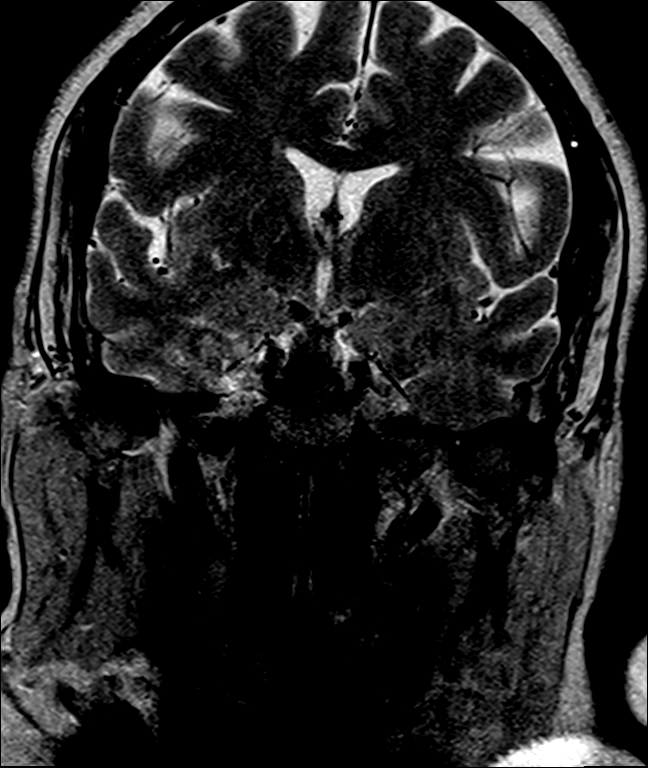

[Series 21: T1 post-contrast · sagittal · 3.0mm · 0.33mm/px · 1 of 11 slices shown (1 of 3)]
[im 1/11]
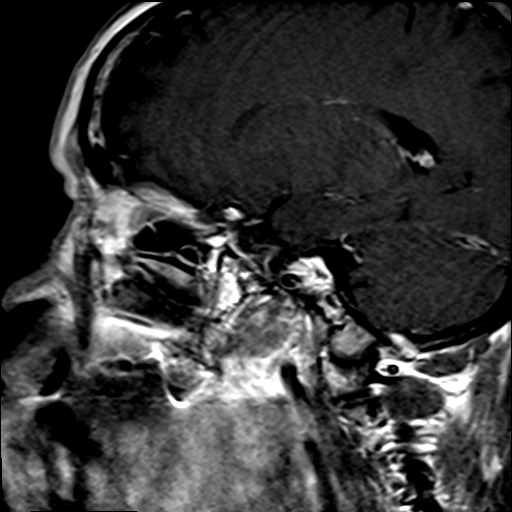

[Series 22: T1 post-contrast · coronal · 3.0mm · 0.33mm/px · 1 of 11 slices shown (2 of 3)]
[im 1/11]
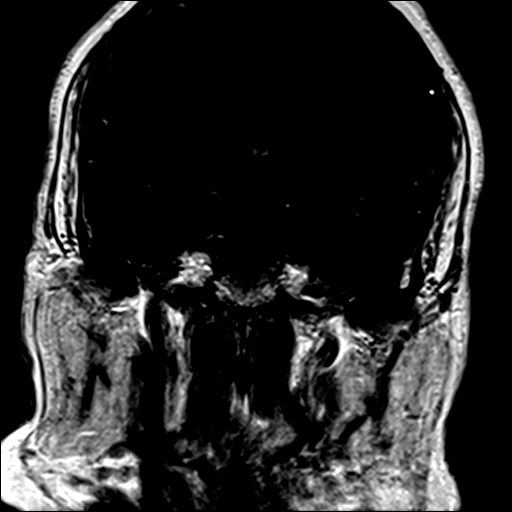

[Series 24: T1 post-contrast · coronal · 5.0mm · 0.45mm/px · 3 of 26 slices shown (3 of 3)]
[im 1/26]
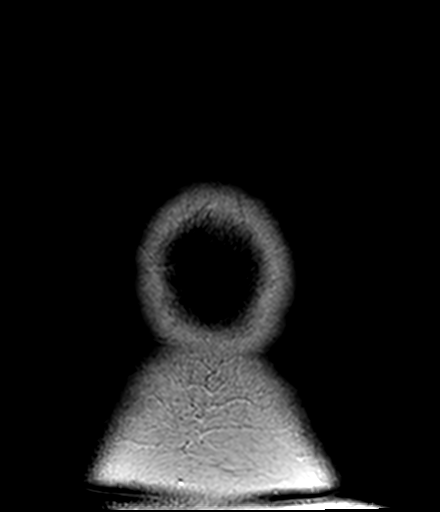
[im 13/26]
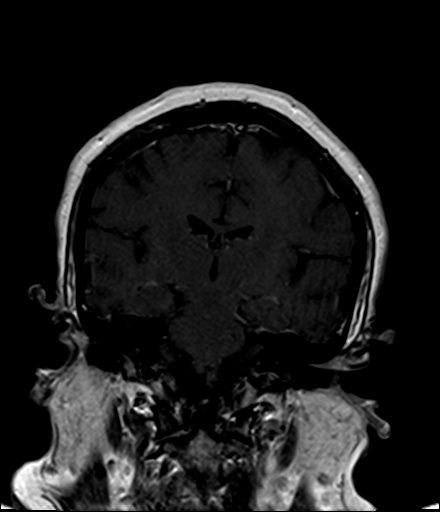
[im 26/26]
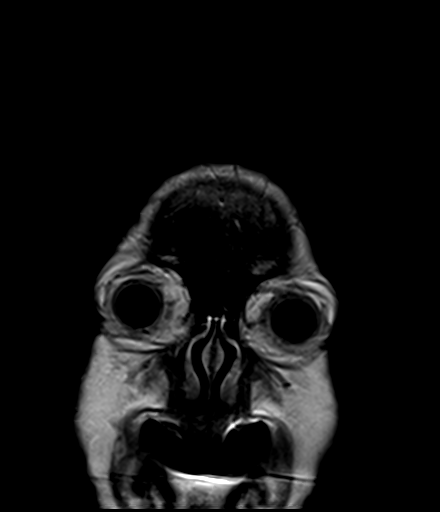

[33 of 48 positions shown; findings below may reference images not displayed]

FINDINGS: Brain: No acute infarction, hemorrhage, hydrocephalus, extra-axial
collection or mass lesion outside the pituitary. No abnormal
enhancement outside of the pituitary.

Dedicated pituitary protocol was performed. The dynamic postcontrast
imaging is limited by artifact; however, there appears to be
relatively homogeneous enhancement the pituitary mass. The pituitary
gland does not appear to be separate from the mass. The mass
measures approximately 1.6 by 1.8 x 1.5 cm, similar to recent MRI.
The mass has areas of internal cystic change. The mass approaches
the cavernous sinus bilaterally with potential invasion. No
substantial suprasellar extension. No mass effect on the optic
chiasm. Similar remodeling of the clivus inferiorly. Unremarkable
infundibulum.

Vascular: Major arterial flow voids are maintained skull base.

Skull and upper cervical spine: Normal marrow signal.

Sinuses/Orbits: Clear sinuses.  Unremarkable orbits.

Other: Small right mastoid effusion.
IMPRESSION: Redemonstrated pituitary mass, which is further described above and
most likely represents a pituitary macroadenoma.

## 2023-02-09 ENCOUNTER — Ambulatory Visit
Admission: RE | Admit: 2023-02-09 | Discharge: 2023-02-09 | Disposition: A | Discharge status: Home / Self Care | Payer: 59 | Source: Ambulatory Visit | Attending: Nurse Practitioner | Admitting: Nurse Practitioner

## 2023-02-09 DIAGNOSIS — R928 Other abnormal and inconclusive findings on diagnostic imaging of breast: Secondary | ICD-10-CM

## 2023-02-10 ENCOUNTER — Other Ambulatory Visit: Payer: Self-pay | Admitting: Nurse Practitioner

## 2023-02-10 DIAGNOSIS — N6310 Unspecified lump in the right breast, unspecified quadrant: Secondary | ICD-10-CM

## 2023-02-25 ENCOUNTER — Ambulatory Visit (INDEPENDENT_AMBULATORY_CARE_PROVIDER_SITE_OTHER): Payer: 59 | Admitting: Podiatry

## 2023-02-25 ENCOUNTER — Encounter: Payer: Self-pay | Admitting: Podiatry

## 2023-02-25 DIAGNOSIS — E1165 Type 2 diabetes mellitus with hyperglycemia: Secondary | ICD-10-CM | POA: Diagnosis not present

## 2023-02-25 DIAGNOSIS — B351 Tinea unguium: Secondary | ICD-10-CM

## 2023-02-25 DIAGNOSIS — M79674 Pain in right toe(s): Secondary | ICD-10-CM

## 2023-02-25 DIAGNOSIS — M79675 Pain in left toe(s): Secondary | ICD-10-CM

## 2023-02-25 NOTE — Progress Notes (Signed)
This patient presents to the office with chief complaint of long thick painful nails.  Patient says the nails are painful walking and wearing shoes.  This patient is unable to self treat.  This patient is unable to trim her nails since she is unable to reach her nails.  She presents to the office for preventative foot care services. ? ?General Appearance  Alert, conversant and in no acute stress. ? ?Vascular  Dorsalis pedis and posterior tibial  pulses are palpable  bilaterally.  Capillary return is within normal limits  bilaterally. Temperature is within normal limits  bilaterally. ? ?Neurologic  Senn-Weinstein monofilament wire test within normal limits  bilaterally. Muscle power within normal limits bilaterally. ? ?Nails Thick disfigured discolored nails with subungual debris  hallux nails  bilaterally. No evidence of bacterial infection or drainage bilaterally. ? ?Orthopedic  No limitations of motion  feet .  No crepitus or effusions noted.  No bony pathology or digital deformities noted. ? ?Skin  normotropic skin with no porokeratosis noted bilaterally.  No signs of infections or ulcers noted.    ? ?Onychomycosis  Nails  B/L.  Pain in right toes  Pain in left toes ? ?Debridement of nails both feet followed trimming the nails with dremel tool.   RTC 3 months. ? ? ?[employer] DPM   ?

## 2023-03-03 ENCOUNTER — Telehealth: Payer: Self-pay | Admitting: Nurse Practitioner

## 2023-03-03 NOTE — Telephone Encounter (Signed)
Error , no longer our pt

## 2023-03-18 ENCOUNTER — Other Ambulatory Visit: Payer: Self-pay | Admitting: Family Medicine

## 2023-03-18 ENCOUNTER — Telehealth: Payer: Self-pay | Admitting: Family Medicine

## 2023-03-18 DIAGNOSIS — E1165 Type 2 diabetes mellitus with hyperglycemia: Secondary | ICD-10-CM

## 2023-03-18 NOTE — Telephone Encounter (Signed)
Spoke to patient and she is aware and has been getting the Duloxetine form mail order as well as the Marcelline Deist was sent to the pharmacy today by provider. Dm/cma

## 2023-03-18 NOTE — Telephone Encounter (Signed)
Prescription Request  03/18/2023  LOV: 10/08/2022  What is the name of the medication or equipment?  Farxiga FARXIGA 10 MG TABS tablet, and DULoxetine   Have you contacted your pharmacy to request a refill? Yes   Which pharmacy would you like this sent to?  [facility] - [place], [place] - [address] [place] [address] Phone: [phone] Fax: [phone]    Patient notified that their request is being sent to the clinical staff for review and that they should receive a response within 2 business days.   Please advise at Mobile [phone] (mobile)   Pt si request rx pharmacy and the # is [phone]

## 2023-03-25 ENCOUNTER — Telehealth: Payer: Self-pay | Admitting: Family Medicine

## 2023-03-25 ENCOUNTER — Other Ambulatory Visit: Payer: Self-pay

## 2023-03-25 DIAGNOSIS — E1165 Type 2 diabetes mellitus with hyperglycemia: Secondary | ICD-10-CM

## 2023-03-25 MED ORDER — FARXIGA 10 MG PO TABS
10.0000 mg | ORAL_TABLET | Freq: Every day | ORAL | 0 refills | Status: AC
Start: 2023-03-25 — End: ?

## 2023-03-25 NOTE — Telephone Encounter (Signed)
Pt is requesting all her meds be sent to [facility] from now on.

## 2023-05-18 ENCOUNTER — Emergency Department (HOSPITAL_COMMUNITY)
Admission: EM | Admit: 2023-05-18 | Discharge: 2023-05-18 | Disposition: A | Discharge status: Home / Self Care | Payer: 59 | Attending: Emergency Medicine | Admitting: Emergency Medicine

## 2023-05-18 ENCOUNTER — Other Ambulatory Visit: Payer: Self-pay

## 2023-05-18 ENCOUNTER — Emergency Department (HOSPITAL_COMMUNITY): Payer: 59

## 2023-05-18 DIAGNOSIS — E1165 Type 2 diabetes mellitus with hyperglycemia: Secondary | ICD-10-CM | POA: Insufficient documentation

## 2023-05-18 DIAGNOSIS — R079 Chest pain, unspecified: Secondary | ICD-10-CM | POA: Diagnosis present

## 2023-05-18 DIAGNOSIS — Z7984 Long term (current) use of oral hypoglycemic drugs: Secondary | ICD-10-CM | POA: Insufficient documentation

## 2023-05-18 DIAGNOSIS — R739 Hyperglycemia, unspecified: Secondary | ICD-10-CM

## 2023-05-18 LAB — BASIC METABOLIC PANEL
Anion gap: 13 (ref 5–15)
BUN: 11 mg/dL (ref 8–23)
CO2: 22 mmol/L (ref 22–32)
Calcium: 9.4 mg/dL (ref 8.9–10.3)
Chloride: 102 mmol/L (ref 98–111)
Creatinine, Ser: 0.77 mg/dL (ref 0.44–1.00)
GFR, Estimated: >60 mL/min (ref >60)
Glucose, Bld: 265 mg/dL — ABNORMAL HIGH (ref 70–99)
Potassium: 4.3 mmol/L (ref 3.5–5.1)
Sodium: 137 mmol/L (ref 135–145)

## 2023-05-18 LAB — CBC
HCT: 38.9 % (ref 36.0–46.0)
Hemoglobin: 12.2 g/dL (ref 12.0–15.0)
MCH: 24.7 pg — ABNORMAL LOW (ref 26.0–34.0)
MCHC: 31.4 g/dL (ref 30.0–36.0)
MCV: 78.9 fL — ABNORMAL LOW (ref 80.0–100.0)
Platelets: 317 10*3/uL (ref 150–400)
RBC: 4.93 MIL/uL (ref 3.87–5.11)
RDW: 14.6 % (ref 11.5–15.5)
WBC: 9.3 10*3/uL (ref 4.0–10.5)
nRBC: 0 % (ref 0.0–0.2)

## 2023-05-18 LAB — CBG MONITORING, ED: Glucose-Capillary: 262 mg/dL — ABNORMAL HIGH (ref 70–99)

## 2023-05-18 LAB — TROPONIN I (HIGH SENSITIVITY)
Troponin I (High Sensitivity): 4 ng/L (ref <18)
Troponin I (High Sensitivity): 5 ng/L (ref <18)

## 2023-05-18 MED ORDER — LACTATED RINGERS IV BOLUS
1000.0000 mL | Freq: Once | INTRAVENOUS | Status: AC
  Administered 2023-05-18: 1000 mL via INTRAVENOUS

## 2023-05-18 NOTE — Discharge Instructions (Signed)
Other than your blood sugar being elevated today your blood work is normal.  No signs of issues with your heart today.  Make sure you are avoiding sodas and candy.  Continue to take your normal medications.

## 2023-05-18 NOTE — ED Provider Notes (Signed)
[place] EMERGENCY DEPARTMENT AT [facility] Provider Note   CSN: [id number] Arrival date & time: 05/18/23  1309     History  Chief Complaint  Patient presents with   Chest Pain    Jilisa Dhanraj is a 72 y.o. female.  Patient is a 72 year old female with a history of diabetes who is presenting here today with multiple complaints.  Patient reports over the last 2 days she has been sweating profusely and has noticed her blood sugar has been elevated.  Her daughter did report that her air conditioning went out a few days ago but is not sure if that is why she has been sweating.  Patient states that on the way over here she also developed left-sided chest pain but denied any shortness of breath.  She states it feels like gas and she gets this pain frequently.  She was given nitro and aspirin and reports the pain has relieved as well as her sweating.  She has not had cough, fever, leg swelling.  No known heart disease.  She has been compliant with her medications but her daughter does state that patient does not do a very good job of following a diabetic diet.  The history is provided by the patient and a relative.  Chest Pain      Home Medications Prior to Admission medications   Medication Sig Start Date End Date Taking? Authorizing Provider  ACCU-CHEK GUIDE test strip  12/15/21   [provider]  Accu-Chek Softclix Lancets lancets SMARTSIG:Topical Patient not taking: Reported on 10/08/2022 12/16/21   [provider]  BAYER ASPIRIN PO Take 81 mg by mouth.    [provider]  Blood Glucose Monitoring Suppl (ACCU-CHEK GUIDE) w/Device KIT  12/15/21   [provider]  diclofenac Sodium (VOLTAREN) 1 % GEL Apply 1 application topically 4 (four) times daily as needed (knees). 04/28/21   [provider]  diphenhydrAMINE (BENADRYL) 25 MG tablet Take 25 mg by mouth every 6 (six) hours as needed for itching.    [provider]   DULoxetine (CYMBALTA) 60 MG capsule Take 1 capsule (60 mg total) by mouth daily. 10/08/22   Mliss Sax, MD  famotidine (PEPCID) 20 MG tablet TAKE 1 TABLET BY MOUTH DAILY 06/29/22   Mliss Sax, MD  FARXIGA 10 MG TABS tablet Take 1 tablet (10 mg total) by mouth daily. 03/25/23   Mliss Sax, MD  haloperidol (HALDOL) 10 MG tablet Take 1 tablet (10 mg total) by mouth at bedtime. 10/08/22   Mliss Sax, MD  Iron, Ferrous Sulfate, 325 (65 Fe) MG TABS Take 325 mg by mouth every other day. Patient not taking: Reported on 11/30/2022 10/08/22   Mliss Sax, MD  LINZESS 145 MCG CAPS capsule TAKE 1 CAPSULE BY MOUTH DAILY AS NEEDED FOR CONSTIPATION 04/09/22   Mliss Sax, MD  lisinopril (ZESTRIL) 5 MG tablet Take 1 tablet (5 mg total) by mouth daily. Patient not taking: Reported on 10/08/2022 07/27/22   Mliss Sax, MD  metFORMIN (GLUCOPHAGE) 1000 MG tablet Take 1 tablet (1,000 mg total) by mouth 2 (two) times daily. 10/08/22   Mliss Sax, MD  simvastatin (ZOCOR) 40 MG tablet Take 1 tablet (40 mg total) by mouth at bedtime. Patient not taking: Reported on 11/30/2022 10/08/22   Mliss Sax, MD  trolamine salicylate (ASPERCREME) 10 % cream Apply 1 application topically as needed (knee pain).    [provider]  Allergies    Lemon oil    Review of Systems   Review of Systems  Cardiovascular:  Positive for chest pain.    Physical Exam Updated Vital Signs BP 134/85 (BP Location: Right Arm)   Pulse 73   Temp 98.7 F (37.1 C) (Oral)   Resp 16   SpO2 98%  Physical Exam Vitals and nursing note reviewed.  Constitutional:      General: She is not in acute distress.    Appearance: She is well-developed.  HENT:     Head: Normocephalic and atraumatic.  Eyes:     Pupils: Pupils are equal, round, and reactive to light.  Cardiovascular:     Rate and Rhythm: Normal rate and regular rhythm.     Heart sounds:  Normal heart sounds. No murmur heard.    No friction rub.  Pulmonary:     Effort: Pulmonary effort is normal.     Breath sounds: Normal breath sounds. No wheezing or rales.  Abdominal:     General: Bowel sounds are normal. There is no distension.     Palpations: Abdomen is soft.     Tenderness: There is no abdominal tenderness. There is no guarding or rebound.  Musculoskeletal:        General: No tenderness. Normal range of motion.     Right lower leg: No tenderness. No edema.     Left lower leg: No tenderness. No edema.     Comments: No edema  Skin:    General: Skin is warm and dry.     Findings: No rash.  Neurological:     Mental Status: She is alert and oriented to person, place, and time.     Cranial Nerves: No cranial nerve deficit.  Psychiatric:        Behavior: Behavior normal.     ED Results / Procedures / Treatments   Labs (all labs ordered are listed, but only abnormal results are displayed) Labs Reviewed  BASIC METABOLIC PANEL - Abnormal; Notable for the following components:      Result Value   Glucose, Bld 265 (*)    All other components within normal limits  CBC - Abnormal; Notable for the following components:   MCV 78.9 (*)    MCH 24.7 (*)    All other components within normal limits  CBG MONITORING, ED - Abnormal; Notable for the following components:   Glucose-Capillary 262 (*)    All other components within normal limits  TROPONIN I (HIGH SENSITIVITY)  TROPONIN I (HIGH SENSITIVITY)    EKG EKG Interpretation Date/Time:  Tuesday May 18 2023 13:01:25 EDT Ventricular Rate:  85 PR Interval:  148 QRS Duration:  68 QT Interval:  346 QTC Calculation: 411 R Axis:   4  Text Interpretation: Normal sinus rhythm Cannot rule out Anterior infarct , age undetermined Nonspecific T wave abnormality RESOLVED SINCE PREVIOUS When compared with ECG of 29-Jul-2021 21:50, PREVIOUS ECG IS PRESENT Confirmed by Gwyneth Sprout ([id number]) on 05/18/2023 5:51:10  PM  Radiology DG Chest 2 View  Result Date: 05/18/2023 CLINICAL DATA:  Chest pain.  Sweating. EXAM: CHEST - 2 VIEW COMPARISON:  07/29/2021 FINDINGS: The cardiomediastinal contours are normal. Chronic eventration of right hemidiaphragm. Pulmonary vasculature is normal. No consolidation, pleural effusion, or pneumothorax. No acute osseous abnormalities are seen. IMPRESSION: No acute chest findings. Chronic eventration of right hemidiaphragm. Electronically Signed   By: Narda Rutherford M.D.   On: 05/18/2023 16:04    Procedures Procedures    Medications Ordered  in ED Medications  lactated ringers bolus 1,000 mL (1,000 mLs Intravenous New Bag/Given 05/18/23 1803)    ED Course/ Medical Decision Making/ A&P                                 Medical Decision Making Amount and/or Complexity of Data Reviewed Labs: ordered. Decision-making details documented in ED Course. Radiology: ordered and independent interpretation performed. Decision-making details documented in ED Course. ECG/medicine tests: ordered and independent interpretation performed. Decision-making details documented in ED Course.   Pt with multiple medical problems and comorbidities and presenting today with a complaint that caries a high risk for morbidity and mortality.  Here today with complaint of sweating all over, elevated blood sugar and chest pain.  Patient's chest pain is nonspecific does not have associated symptoms.  It did relieve nitroglycerin but seems more GI in nature based on patient's description.  She has no infectious symptoms today and low suspicion for pneumonia.  Low risk Wells and low suspicion for PE based on patient's exam today.  Concern for hyperglycemia, atypical ACS, GERD.  I independently interpreted patient's EKG and labs.  BMP today with hyperglycemia with a sugar of 265 but otherwise normal electrolytes and renal function, CBC without acute findings, initial troponin normal at 4.  EKG is normal today  without acute findings.  I have independently visualized and interpreted pt's images today.  Chest x-ray without acute pathology.  Radiology reports chronic right hemidiaphragm elevation.  Patient given fluids for her hyperglycemia.  Delta troponin pending. 7:44 PM Delta trop is neg and pt stable for d/c.         Final Clinical Impression(s) / ED Diagnoses Final diagnoses:  Hyperglycemia  Nonspecific chest pain    Rx / DC Orders ED Discharge Orders     None         Gwyneth Sprout, MD 05/18/23 1944

## 2023-05-18 NOTE — ED Provider Triage Note (Signed)
Emergency Medicine Provider Triage Evaluation Note  Raeah Stouder , a 72 y.o. female  was evaluated in triage.  Pt complains of diaphoresis, chest pain.  Patient reports transient diaphoresis followed by a brief period of chest pain.  EMS administered aspirin and sublingual nitroglycerin.  In triage patient is comfortable and completely pain-free.  She denies prior history of cardiac disease.  Review of Systems  Positive: Chest pain, sweating Negative: Denies shortness of breath, nausea, vomiting, fever  Physical Exam  BP 124/87   Pulse 90   Temp 99 F (37.2 C) (Oral)   Resp 20   SpO2 97%  Gen:   Awake, no distress   Resp:  Normal effort  MSK:   Moves extremities without difficulty  Other:    Medical Decision Making  Medically screening exam initiated at 1:35 PM.  Appropriate orders placed.  Danyal Monclova was informed that the remainder of the evaluation will be completed by another provider, this initial triage assessment does not replace that evaluation, and the importance of remaining in the ED until their evaluation is complete.  Screening labs and imaging and EKG ordered.   Wynetta Fines, MD 05/18/23 1336

## 2023-05-18 NOTE — ED Triage Notes (Signed)
Pt bib [facility] from home with complaints of all over body sweating. Pt started having left sided chest pain en route and was given 1 NTG and 324mg  ASA with relief of the chest pain. Pt no longer sweating. Denies cough, fever, chills prior to arrival.

## 2023-05-20 ENCOUNTER — Telehealth: Payer: Self-pay | Admitting: *Deleted

## 2023-05-20 NOTE — Telephone Encounter (Signed)
Pt called regarding a message she received from [facility].  RNCM reviewed chart but could not locate that a message was sent.  Pt said that her phone has been on the blink and she could have gotten it mixed up from an earlier visit.

## 2023-05-21 ENCOUNTER — Telehealth: Payer: Self-pay

## 2023-05-21 NOTE — Transitions of Care (Post Inpatient/ED Visit) (Signed)
05/21/2023  Name: Margaret Bailey MRN: [id number] DOB: Apr 11, 1951  Today's TOC FU Call Status: Today's TOC FU Call Status:: Successful TOC FU Call Completed Unsuccessful Call (1st Attempt) Date: 05/21/23 [facility] FU Call Complete Date: 05/21/23 Patient's Name and Date of Birth confirmed.  Transition Care Management Follow-up Telephone Call Date of Discharge: 05/18/23  Items Reviewed:    Medications Reviewed Today: Medications Reviewed Today   Medications were not reviewed in this encounter     Home Care and Equipment/Supplies:    Functional Questionnaire:    Follow up appointments reviewed:    Pt has a new PCP. Debria Garret, NP at [facility]  SIGNATURE Arvil Persons, BSN, RN

## 2023-05-27 ENCOUNTER — Encounter: Payer: Self-pay | Admitting: Podiatry

## 2023-05-27 ENCOUNTER — Ambulatory Visit (INDEPENDENT_AMBULATORY_CARE_PROVIDER_SITE_OTHER): Payer: 59 | Admitting: Podiatry

## 2023-05-27 DIAGNOSIS — E1165 Type 2 diabetes mellitus with hyperglycemia: Secondary | ICD-10-CM | POA: Diagnosis not present

## 2023-05-27 DIAGNOSIS — M79674 Pain in right toe(s): Secondary | ICD-10-CM

## 2023-05-27 DIAGNOSIS — B351 Tinea unguium: Secondary | ICD-10-CM | POA: Diagnosis not present

## 2023-05-27 DIAGNOSIS — M79675 Pain in left toe(s): Secondary | ICD-10-CM

## 2023-05-27 NOTE — Progress Notes (Signed)
This patient presents to the office with chief complaint of long thick painful nails.  Patient says the nails are painful walking and wearing shoes.  This patient is unable to self treat.  This patient is unable to trim her nails since she is unable to reach her nails.  She presents to the office for preventative foot care services. ? ?General Appearance  Alert, conversant and in no acute stress. ? ?Vascular  Dorsalis pedis and posterior tibial  pulses are palpable  bilaterally.  Capillary return is within normal limits  bilaterally. Temperature is within normal limits  bilaterally. ? ?Neurologic  Senn-Weinstein monofilament wire test within normal limits  bilaterally. Muscle power within normal limits bilaterally. ? ?Nails Thick disfigured discolored nails with subungual debris  hallux nails  bilaterally. No evidence of bacterial infection or drainage bilaterally. ? ?Orthopedic  No limitations of motion  feet .  No crepitus or effusions noted.  No bony pathology or digital deformities noted. ? ?Skin  normotropic skin with no porokeratosis noted bilaterally.  No signs of infections or ulcers noted.    ? ?Onychomycosis  Nails  B/L.  Pain in right toes  Pain in left toes ? ?Debridement of nails both feet followed trimming the nails with dremel tool.   RTC 3 months. ? ? ?Helane Gunther DPM   ?

## 2023-05-31 ENCOUNTER — Other Ambulatory Visit: Payer: Self-pay | Admitting: Family Medicine

## 2023-05-31 DIAGNOSIS — E1165 Type 2 diabetes mellitus with hyperglycemia: Secondary | ICD-10-CM

## 2023-06-25 ENCOUNTER — Other Ambulatory Visit: Payer: Self-pay | Admitting: Family Medicine

## 2023-06-25 DIAGNOSIS — K581 Irritable bowel syndrome with constipation: Secondary | ICD-10-CM

## 2023-07-07 ENCOUNTER — Encounter: Payer: Self-pay | Admitting: Nurse Practitioner

## 2023-07-16 ENCOUNTER — Other Ambulatory Visit: Payer: Self-pay | Admitting: Family Medicine

## 2023-07-16 DIAGNOSIS — K581 Irritable bowel syndrome with constipation: Secondary | ICD-10-CM

## 2023-07-21 ENCOUNTER — Other Ambulatory Visit: Payer: Self-pay | Admitting: Family Medicine

## 2023-07-21 DIAGNOSIS — E1165 Type 2 diabetes mellitus with hyperglycemia: Secondary | ICD-10-CM

## 2023-08-13 ENCOUNTER — Other Ambulatory Visit: Payer: Self-pay | Admitting: Nurse Practitioner

## 2023-08-13 ENCOUNTER — Ambulatory Visit
Admission: RE | Admit: 2023-08-13 | Discharge: 2023-08-13 | Disposition: A | Discharge status: Home / Self Care | Payer: 59 | Source: Ambulatory Visit | Attending: Nurse Practitioner

## 2023-08-13 ENCOUNTER — Ambulatory Visit
Admission: RE | Admit: 2023-08-13 | Discharge: 2023-08-13 | Disposition: A | Discharge status: Home / Self Care | Payer: 59 | Source: Ambulatory Visit | Attending: Nurse Practitioner | Admitting: Nurse Practitioner

## 2023-08-13 DIAGNOSIS — N6310 Unspecified lump in the right breast, unspecified quadrant: Secondary | ICD-10-CM

## 2023-08-16 ENCOUNTER — Other Ambulatory Visit: Payer: Self-pay | Admitting: Family Medicine

## 2023-08-16 DIAGNOSIS — E1165 Type 2 diabetes mellitus with hyperglycemia: Secondary | ICD-10-CM

## 2023-08-17 ENCOUNTER — Other Ambulatory Visit: Payer: Self-pay | Admitting: Family Medicine

## 2023-08-17 DIAGNOSIS — K581 Irritable bowel syndrome with constipation: Secondary | ICD-10-CM

## 2023-08-17 DIAGNOSIS — E1165 Type 2 diabetes mellitus with hyperglycemia: Secondary | ICD-10-CM

## 2023-08-20 ENCOUNTER — Other Ambulatory Visit: Payer: Self-pay | Admitting: Family Medicine

## 2023-08-20 DIAGNOSIS — E1165 Type 2 diabetes mellitus with hyperglycemia: Secondary | ICD-10-CM

## 2023-08-20 DIAGNOSIS — K581 Irritable bowel syndrome with constipation: Secondary | ICD-10-CM

## 2023-08-26 ENCOUNTER — Encounter: Payer: Self-pay | Admitting: Podiatry

## 2023-08-26 ENCOUNTER — Ambulatory Visit (INDEPENDENT_AMBULATORY_CARE_PROVIDER_SITE_OTHER): Payer: 59 | Admitting: Podiatry

## 2023-08-26 DIAGNOSIS — B351 Tinea unguium: Secondary | ICD-10-CM | POA: Diagnosis not present

## 2023-08-26 DIAGNOSIS — E1165 Type 2 diabetes mellitus with hyperglycemia: Secondary | ICD-10-CM

## 2023-08-26 DIAGNOSIS — M79674 Pain in right toe(s): Secondary | ICD-10-CM

## 2023-08-26 DIAGNOSIS — M79675 Pain in left toe(s): Secondary | ICD-10-CM

## 2023-08-26 NOTE — Progress Notes (Signed)
This patient presents to the office with chief complaint of long thick painful nails.  Patient says the nails are painful walking and wearing shoes.  This patient is unable to self treat.  This patient is unable to trim her nails since she is unable to reach her nails.  She presents to the office for preventative foot care services.  General Appearance  Alert, conversant and in no acute stress.  Vascular  Dorsalis pedis and posterior tibial  pulses are palpable  bilaterally.  Capillary return is within normal limits  bilaterally. Temperature is within normal limits  bilaterally.  Neurologic  Senn-Weinstein monofilament wire test within normal limits  bilaterally. Muscle power within normal limits bilaterally.  Nails Thick disfigured discolored nails with subungual debris  hallux nails  bilaterally. No evidence of bacterial infection or drainage bilaterally.  Orthopedic  No limitations of motion  feet .  No crepitus or effusions noted.  No bony pathology or digital deformities noted.  Skin  normotropic skin  noted bilaterally.  Porokeratosis sub 5th left foot. No signs of infections or ulcers noted.     Onychomycosis  Nails  B/L.  Pain in right toes  Pain in left toes  Debridement of nails both feet followed trimming the nails with dremel tool.   RTC 3   months.   Helane Gunther DPM

## 2023-09-10 ENCOUNTER — Encounter: Payer: 59 | Admitting: Internal Medicine

## 2023-09-10 NOTE — Progress Notes (Signed)
 This encounter was created in error - please disregard.

## 2023-09-13 NOTE — Telephone Encounter (Signed)
 Copied from CRM [id number]. Topic: Clinical - Medication Refill >> Sep 10, 2023  1:02 PM Deidre DASEN wrote: Most Recent Primary Care Visit:  Provider: ALLEN, NICKEAH E  Department: [facility]  Visit Type: MEDICARE AWV, SEQUENTIAL  Date: 11/30/2022  Medication: metFORMIN  (GLUCOPHAGE ) 1000 MG tablet  Has the patient contacted their pharmacy? Yes (Agent: If no, request that the patient contact the pharmacy for the refill. If patient does not wish to contact the pharmacy document the reason why and proceed with request.) (Agent: If yes, when and what did the pharmacy advise?)  Is this the correct pharmacy for this prescription? Yes If no, delete pharmacy and type the correct one.  This is the patient's preferred pharmacy:   [facility] [place] [place] - [address] [place] [address] Phone: [phone] Fax: [phone]   Has the prescription been filled recently? No  Is the patient out of the medication? Yes  Has the patient been seen for an appointment in the last year OR does the patient have an upcoming appointment? Yes  Can we respond through MyChart? No  Agent: Please be advised that Rx refills may take up to 3 business days. We ask that you follow-up with your pharmacy.

## 2023-09-29 ENCOUNTER — Other Ambulatory Visit: Payer: Self-pay | Admitting: Family Medicine

## 2023-09-29 DIAGNOSIS — E1165 Type 2 diabetes mellitus with hyperglycemia: Secondary | ICD-10-CM

## 2023-10-01 ENCOUNTER — Other Ambulatory Visit: Payer: Self-pay | Admitting: Family Medicine

## 2023-10-01 DIAGNOSIS — E1165 Type 2 diabetes mellitus with hyperglycemia: Secondary | ICD-10-CM

## 2023-10-01 NOTE — Telephone Encounter (Signed)
Last Fill: 03/25/23  Last OV: 11/30/22 Next OV: Unk   Routing to provider for review/authorization.

## 2023-10-01 NOTE — Telephone Encounter (Signed)
Copied from CRM [id number]. Topic: Clinical - Medication Refill >> Oct 01, 2023  1:15 PM Kathryne Eriksson wrote: Most Recent Primary Care Visit:  Provider: Barb Merino  Department: [facility]  Visit Type: MEDICARE AWV, SEQUENTIAL  Date: 11/30/2022  Medication: FARXIGA 10 MG TABS tablet  Has the patient contacted their pharmacy? Yes (Agent: If no, request that the patient contact the pharmacy for the refill. If patient does not wish to contact the pharmacy document the reason why and proceed with request.) (Agent: If yes, when and what did the pharmacy advise?)  Is this the correct pharmacy for this prescription? Yes If no, delete pharmacy and type the correct one.  This is the patient's preferred pharmacy:    [facility] [place] [address] [place] [address] Phone: [phone] Fax: [phone]   Has the prescription been filled recently? No  Is the patient out of the medication? Yes  Has the patient been seen for an appointment in the last year OR does the patient have an upcoming appointment? No  Can we respond through MyChart? Yes  Agent: Please be advised that Rx refills may take up to 3 business days. We ask that you follow-up with your pharmacy.

## 2023-10-04 ENCOUNTER — Other Ambulatory Visit: Payer: Self-pay | Admitting: Family Medicine

## 2023-10-04 DIAGNOSIS — F209 Schizophrenia, unspecified: Secondary | ICD-10-CM

## 2023-10-15 ENCOUNTER — Other Ambulatory Visit: Payer: Self-pay | Admitting: Family Medicine

## 2023-10-15 DIAGNOSIS — F209 Schizophrenia, unspecified: Secondary | ICD-10-CM

## 2023-10-15 NOTE — Telephone Encounter (Signed)
Spoke with patient-verified that patient is no longer a patient of [facility] [employer]. Patient has been with [facility] since September 2024. Explained to patient medication request for Cymbalta does need to come from new provider since she no longer is a patient of [facility]. Patient verbalized understanding and all questions answered.

## 2023-10-15 NOTE — Telephone Encounter (Signed)
Copied from CRM [id number]. Topic: Clinical - Medication Refill >> Oct 15, 2023 11:17 AM Theodis Sato wrote: Most Recent Primary Care Visit:  Provider: Barb Merino  Department: [facility]  Visit Type: MEDICARE AWV, SEQUENTIAL  Date: 11/30/2022  Medication: DULoxetine (CYMBALTA) 60 MG capsule  Has the patient contacted their pharmacy? Pharmacy requesting this re-fill. (Agent: If no, request that the patient contact the pharmacy for the refill. If patient does not wish to contact the pharmacy document the reason why and proceed with request.) (Agent: If yes, when and what did the pharmacy advise?)  Is this the correct pharmacy for this prescription? Yes If no, delete pharmacy and type the correct one.  This is the patient's preferred pharmacy:   [facility] [place] [address] [place] [address] Phone: [phone] Fax: [phone]   Has the prescription been filled recently? Yes  Is the patient out of the medication? Yes  Has the patient been seen for an appointment in the last year OR does the patient have an upcoming appointment? No  Can we respond through MyChart? No  Agent: Please be advised that Rx refills may take up to 3 business days. We ask that you follow-up with your pharmacy.

## 2023-10-29 ENCOUNTER — Other Ambulatory Visit: Payer: Self-pay | Admitting: Family Medicine

## 2023-10-29 DIAGNOSIS — F209 Schizophrenia, unspecified: Secondary | ICD-10-CM

## 2023-11-05 ENCOUNTER — Ambulatory Visit (INDEPENDENT_AMBULATORY_CARE_PROVIDER_SITE_OTHER): Payer: 59 | Admitting: Podiatry

## 2023-11-05 ENCOUNTER — Encounter: Payer: Self-pay | Admitting: Podiatry

## 2023-11-05 DIAGNOSIS — M79675 Pain in left toe(s): Secondary | ICD-10-CM | POA: Diagnosis not present

## 2023-11-05 DIAGNOSIS — B351 Tinea unguium: Secondary | ICD-10-CM | POA: Diagnosis not present

## 2023-11-05 DIAGNOSIS — E1165 Type 2 diabetes mellitus with hyperglycemia: Secondary | ICD-10-CM

## 2023-11-05 DIAGNOSIS — M79674 Pain in right toe(s): Secondary | ICD-10-CM

## 2023-11-05 NOTE — Progress Notes (Signed)
 This patient presents to the office with chief complaint of long thick painful nails.  Patient says the nails are painful walking and wearing shoes.  This patient is unable to self treat.  This patient is unable to trim her nails since she is unable to reach her nails.  She presents to the office for preventative foot care services.  General Appearance  Alert, conversant and in no acute stress.  Vascular  Dorsalis pedis and posterior tibial  pulses are palpable  bilaterally.  Capillary return is within normal limits  bilaterally. Temperature is within normal limits  bilaterally.  Neurologic  Senn-Weinstein monofilament wire test within normal limits  bilaterally. Muscle power within normal limits bilaterally.  Nails Thick disfigured discolored nails with subungual debris  hallux nails  bilaterally. No evidence of bacterial infection or drainage bilaterally. She has injured fourth toenail right foot.  Orthopedic  No limitations of motion  feet .  No crepitus or effusions noted.  No bony pathology or digital deformities noted.  Skin  normotropic skin  noted bilaterally.  Porokeratosis sub 5th left foot. No signs of infections or ulcers noted.     Onychomycosis  Nails  B/L.  Pain in right toes  Pain in left toes  Debridement of nails both feet followed trimming the nails with dremel tool.   RTC 10 weeks    Helane Gunther DPM

## 2024-01-17 ENCOUNTER — Encounter: Payer: Self-pay | Admitting: Podiatry

## 2024-01-17 ENCOUNTER — Ambulatory Visit (INDEPENDENT_AMBULATORY_CARE_PROVIDER_SITE_OTHER): Admitting: Podiatry

## 2024-01-17 DIAGNOSIS — E1165 Type 2 diabetes mellitus with hyperglycemia: Secondary | ICD-10-CM

## 2024-01-17 DIAGNOSIS — B351 Tinea unguium: Secondary | ICD-10-CM | POA: Diagnosis not present

## 2024-01-17 DIAGNOSIS — M79674 Pain in right toe(s): Secondary | ICD-10-CM | POA: Diagnosis not present

## 2024-01-17 DIAGNOSIS — M79675 Pain in left toe(s): Secondary | ICD-10-CM | POA: Diagnosis not present

## 2024-01-17 NOTE — Progress Notes (Addendum)
 This patient presents to the office with chief complaint of long thick painful nails.  Patient says the nails are painful walking and wearing shoes.  This patient is unable to self treat.  This patient is unable to trim her nails since she is unable to reach her nails.  She presents to the office for preventative foot care services.  General Appearance  Alert, conversant and in no acute stress.  Vascular  Dorsalis pedis and posterior tibial  pulses are palpable  bilaterally.  Capillary return is within normal limits  bilaterally. Temperature is within normal limits  bilaterally.  Neurologic  Senn-Weinstein monofilament wire test within normal limits  bilaterally. Muscle power within normal limits bilaterally.  Nails Thick disfigured discolored nails with subungual debris  hallux nails  bilaterally. No evidence of bacterial infection or drainage bilaterally.   Orthopedic  No limitations of motion  feet .  No crepitus or effusions noted.  No bony pathology or digital deformities noted.  Skin  normotropic skin  noted bilaterally.  Porokeratosis sub 5th left foot. No signs of infections or ulcers noted.     Onychomycosis  Nails  B/L.  Pain in right toes  Pain in left toes  Debridement of nails both feet followed trimming the nails with dremel tool.   RTC 10 weeks    Ruffin Cotton DPM

## 2024-02-15 ENCOUNTER — Encounter

## 2024-02-15 ENCOUNTER — Other Ambulatory Visit

## 2024-02-20 ENCOUNTER — Emergency Department (HOSPITAL_COMMUNITY)

## 2024-02-20 ENCOUNTER — Other Ambulatory Visit: Payer: Self-pay

## 2024-02-20 ENCOUNTER — Emergency Department (HOSPITAL_COMMUNITY)
Admission: EM | Admit: 2024-02-20 | Discharge: 2024-02-20 | Disposition: A | Discharge status: Home / Self Care | Attending: Emergency Medicine | Admitting: Emergency Medicine

## 2024-02-20 DIAGNOSIS — Z794 Long term (current) use of insulin: Secondary | ICD-10-CM | POA: Diagnosis not present

## 2024-02-20 DIAGNOSIS — K5900 Constipation, unspecified: Secondary | ICD-10-CM | POA: Diagnosis not present

## 2024-02-20 DIAGNOSIS — R109 Unspecified abdominal pain: Secondary | ICD-10-CM | POA: Diagnosis present

## 2024-02-20 MED ORDER — POLYETHYLENE GLYCOL 3350 17 G PO PACK: 17.0000 g | PACK | Freq: Two times a day (BID) | ORAL | 0 refills | Status: AC

## 2024-02-20 MED ORDER — GLYCERIN (ADULT) 2 G RE SUPP: 1.0000 | RECTAL | 0 refills | Status: AC | PRN

## 2024-02-20 MED ORDER — FLEET ENEMA RE ENEM
1.0000 | ENEMA | Freq: Once | RECTAL | Status: AC
  Administered 2024-02-20: 1 via RECTAL
  Filled 2024-02-20: qty 1

## 2024-02-20 NOTE — Discharge Instructions (Signed)
 You were seen for constipation in the emergency department.   At home, please take the MiraLAX twice a day as well as the glycerin suppositories once a day until you are having regular bowel movements.    Check your MyChart online for the results of any tests that had not resulted by the time you left the emergency department.   Follow-up with your primary doctor in 2-3 days regarding your visit.    Return immediately to the emergency department if you experience any of the following: Severe abdominal pain, vomiting, or any other concerning symptoms.    Thank you for visiting our Emergency Department. It was a pleasure taking care of you today.

## 2024-02-20 NOTE — ED Triage Notes (Signed)
 Patient BIB EMS for evaluation of abdominal pain and constipation that started on Thursday.  Reports she took 4 Linzess  pills on Thursday and was unable to have a BM.  Abdominal pain improves with Pepto.  States pain feels like someone is wringing out a dish towel.  No reports of nausea or vomiting.  Pt ambulatory to room with EMS.

## 2024-02-20 NOTE — ED Provider Notes (Signed)
 [place] EMERGENCY DEPARTMENT AT [facility] Provider Note   CSN: [id number] Arrival date & time: 02/20/24  [id number]     Patient presents with: Abdominal Pain and Constipation   Margaret Bailey is a 73 y.o. female.   72 year old female with history of constipation presents emergency department constipation and abdominal discomfort.  Patient reports she has been constipated since Thursday.  Still is passing gas.  Since then has had an uncomfortable cramping sensation in her upper abdomen.  Has tried Linzess  and over-the-counter laxatives but has not been able to have a bowel movement so she presented to the emergency department.  Denies any nausea or vomiting.  Has had 2 C-sections but denies any other abdominal surgeries.       Prior to Admission medications   Medication Sig Start Date End Date Taking? Authorizing Provider  glycerin adult 2 g suppository Place 1 suppository rectally as needed for constipation. 02/20/24  Yes Yolande Lamar BROCKS, MD  polyethylene glycol (MIRALAX) 17 g packet Take 17 g by mouth 2 (two) times daily. 02/20/24  Yes Yolande Lamar BROCKS, MD  ACCU-CHEK GUIDE test strip  12/15/21   [provider]  Accu-Chek Softclix Lancets lancets  12/16/21   [provider]  BAYER ASPIRIN PO Take 81 mg by mouth.    [provider]  Blood Glucose Monitoring Suppl (ACCU-CHEK GUIDE) w/Device KIT  12/15/21   [provider]  diclofenac  Sodium (VOLTAREN ) 1 % GEL Apply 1 application topically 4 (four) times daily as needed (knees). 04/28/21   [provider]  diphenhydrAMINE  (BENADRYL ) 25 MG tablet Take 25 mg by mouth every 6 (six) hours as needed for itching.    [provider]  DULoxetine  (CYMBALTA ) 60 MG capsule Take 1 capsule (60 mg total) by mouth daily. 10/08/22   Berneta Elsie Sayre, MD  famotidine  (PEPCID ) 20 MG tablet TAKE 1 TABLET BY MOUTH DAILY 06/29/22   Berneta Elsie Sayre, MD  FARXIGA  10 MG TABS tablet Take 1  tablet (10 mg total) by mouth daily. 03/25/23   Berneta Elsie Sayre, MD  haloperidol  (HALDOL ) 10 MG tablet Take 1 tablet (10 mg total) by mouth at bedtime. 10/08/22   Berneta Elsie Sayre, MD  Iron , Ferrous Sulfate , 325 (65 Fe) MG TABS Take 325 mg by mouth every other day. 10/08/22   Berneta Elsie Sayre, MD  LINZESS  145 MCG CAPS capsule TAKE 1 CAPSULE BY MOUTH DAILY AS NEEDED FOR CONSTIPATION 04/09/22   Berneta Elsie Sayre, MD  lisinopril  (ZESTRIL ) 5 MG tablet Take 1 tablet (5 mg total) by mouth daily. 07/27/22   Berneta Elsie Sayre, MD  metFORMIN  (GLUCOPHAGE ) 1000 MG tablet Take 1 tablet (1,000 mg total) by mouth 2 (two) times daily. 10/08/22   Berneta Elsie Sayre, MD  simvastatin  (ZOCOR ) 40 MG tablet Take 1 tablet (40 mg total) by mouth at bedtime. 10/08/22   Berneta Elsie Sayre, MD  trolamine salicylate (ASPERCREME) 10 % cream Apply 1 application topically as needed (knee pain).    [provider]    Allergies: Lemon oil    Review of Systems  Updated Vital Signs BP 128/83 (BP Location: Right Arm)   Pulse 85   Temp 98.9 F (37.2 C) (Oral)   Resp 18   Ht 5' 1 (1.549 m)   Wt 78.1 kg   SpO2 100%   BMI 32.54 kg/m   Physical Exam Vitals and nursing note reviewed.  Constitutional:      General: She is not in acute distress.  Appearance: She is well-developed.  HENT:     Head: Normocephalic and atraumatic.     Right Ear: External ear normal.     Left Ear: External ear normal.     Nose: Nose normal.   Eyes:     Extraocular Movements: Extraocular movements intact.     Conjunctiva/sclera: Conjunctivae normal.     Pupils: Pupils are equal, round, and reactive to light.   Abdominal:     General: Abdomen is flat. There is no distension.     Palpations: Abdomen is soft. There is no mass.     Tenderness: There is no abdominal tenderness. There is no guarding.   Musculoskeletal:     Cervical back: Normal range of motion and neck supple.   Skin:    General:  Skin is warm and dry.   Neurological:     Mental Status: She is alert and oriented to person, place, and time. Mental status is at baseline.   Psychiatric:        Mood and Affect: Mood normal.     (all labs ordered are listed, but only abnormal results are displayed) Labs Reviewed - No data to display  EKG: None  Radiology: DG Abd 2 Views Result Date: 02/20/2024 EXAM: 2 VIEW XRAY OF THE ABDOMEN SUPINE 02/20/2024 07:28:00 AM COMPARISON: None available. CLINICAL HISTORY: 358444 Constipation 358444. Reason for exam: pt order states constipation; Per ED triage notes: Patient BIB EMS for evaluation of abdominal pain and constipation that started on Thursday. Abdominal pain improves with Pepto. FINDINGS: BOWEL: Nonobstructive bowel gas pattern. PERITONEUM AND SOFT TISSUES: No abnormal calcifications. BONES: Mild rightward curvature is present at L3-4. No acute osseous abnormality. IMPRESSION: 1. No acute findings. Normal bowel gas pattern. Electronically signed by: Lonni Necessary MD 02/20/2024 07:35 AM EDT RP Workstation: [id number]     Procedures   Medications Ordered in the ED  sodium phosphate (FLEET) enema 1 enema (1 enema Rectal Given 02/20/24 9195)                                    Medical Decision Making Amount and/or Complexity of Data Reviewed Radiology: ordered.  Risk OTC drugs.   73 year old female with history of constipation presents emergency department constipation and abdominal discomfort.  Initial Ddx:  Constipation, SBO, ileus  MDM/Course:  Patient presents emergency department with constipation.  Does have a history of this.  Has some mild abdominal discomfort but no significant abdominal tenderness to palpation on exam.  She is overall very well-appearing.  Had an abdominal x-ray that shows a nonobstructive bowel gas pattern.  Given an enema and discharged home with a new bowel regimen.  Will have her follow-up with her primary doctor in several days.    This patient presents to the ED for concern of complaints listed in HPI, this involves an extensive number of treatment options, and is a complaint that carries with it a high risk of complications and morbidity. Disposition including potential need for admission considered.   Dispo: DC Home. Return precautions discussed including, but not limited to, those listed in the AVS. Allowed pt time to ask questions which were answered fully prior to dc.  Records reviewed Outpatient Clinic Notes I independently reviewed the following imaging with scope of interpretation limited to determining acute life threatening conditions related to emergency care: Abdominal x-ray and agree with the radiologist interpretation with the following exceptions: none I have reviewed  the patients home medications and made adjustments as needed Social Determinants of health:  Geriatric  Portions of this note were generated with Scientist, clinical (histocompatibility and immunogenetics). Dictation errors may occur despite best attempts at proofreading.    Final diagnoses:  Constipation, unspecified constipation type    ED Discharge Orders          Ordered    glycerin adult 2 g suppository  As needed        02/20/24 0744    polyethylene glycol (MIRALAX) 17 g packet  2 times daily        02/20/24 0744               Yolande Lamar BROCKS, MD 02/20/24 [phone]

## 2024-02-21 ENCOUNTER — Ambulatory Visit
Admission: RE | Admit: 2024-02-21 | Discharge: 2024-02-21 | Discharge status: Home / Self Care | Source: Ambulatory Visit | Attending: Nurse Practitioner

## 2024-02-21 DIAGNOSIS — N6310 Unspecified lump in the right breast, unspecified quadrant: Secondary | ICD-10-CM

## 2024-03-27 ENCOUNTER — Encounter: Payer: Self-pay | Admitting: Podiatry

## 2024-03-27 ENCOUNTER — Ambulatory Visit (INDEPENDENT_AMBULATORY_CARE_PROVIDER_SITE_OTHER): Admitting: Podiatry

## 2024-03-27 DIAGNOSIS — M79675 Pain in left toe(s): Secondary | ICD-10-CM | POA: Diagnosis not present

## 2024-03-27 DIAGNOSIS — E1165 Type 2 diabetes mellitus with hyperglycemia: Secondary | ICD-10-CM | POA: Diagnosis not present

## 2024-03-27 DIAGNOSIS — M79674 Pain in right toe(s): Secondary | ICD-10-CM

## 2024-03-27 DIAGNOSIS — B351 Tinea unguium: Secondary | ICD-10-CM

## 2024-03-27 NOTE — Progress Notes (Signed)
 This patient presents to the office with chief complaint of long thick painful nails.  Patient says the nails are painful walking and wearing shoes.  This patient is unable to self treat.  This patient is unable to trim her nails since she is unable to reach her nails. She has pain on top of tendon right big toe. She presents to the office for preventative foot care services.  General Appearance  Alert, conversant and in no acute stress.  Vascular  Dorsalis pedis and posterior tibial  pulses are palpable  bilaterally.  Capillary return is within normal limits  bilaterally. Temperature is within normal limits  bilaterally.  Neurologic  Senn-Weinstein monofilament wire test within normal limits  bilaterally. Muscle power within normal limits bilaterally.  Nails Thick disfigured discolored nails with subungual debris  hallux nails  bilaterally. No evidence of bacterial infection or drainage bilaterally.   Orthopedic  No limitations of motion  feet .  No crepitus or effusions noted.  No bony pathology or digital deformities noted. 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  Skin  normotropic skin  noted bilaterally.  Porokeratosis sub 5th left foot. No signs of infections or ulcers noted.     Onychomycosis  Nails  B/L.  Pain in right toes  Pain in left toes  Debridement of nails both feet followed trimming the nails with dremel tool.   RTC 10 weeks    Cordella Bold DPM

## 2024-06-22 ENCOUNTER — Ambulatory Visit: Admitting: Podiatry

## 2024-06-22 ENCOUNTER — Encounter: Payer: Self-pay | Admitting: Podiatry

## 2024-06-22 DIAGNOSIS — M79675 Pain in left toe(s): Secondary | ICD-10-CM

## 2024-06-22 DIAGNOSIS — B351 Tinea unguium: Secondary | ICD-10-CM | POA: Diagnosis not present

## 2024-06-22 DIAGNOSIS — M79674 Pain in right toe(s): Secondary | ICD-10-CM

## 2024-06-22 DIAGNOSIS — E1165 Type 2 diabetes mellitus with hyperglycemia: Secondary | ICD-10-CM | POA: Diagnosis not present

## 2024-06-22 NOTE — Progress Notes (Signed)
 This patient presents to the office with chief complaint of long thick painful nails.  Patient says the nails are painful walking and wearing shoes.  This patient is unable to self treat.  This patient is unable to trim her nails since she is unable to reach her nails. She has pain on top of tendon right big toe. She presents to the office for preventative foot care services.  General Appearance  Alert, conversant and in no acute stress.  Vascular  Dorsalis pedis and posterior tibial  pulses are palpable  bilaterally.  Capillary return is within normal limits  bilaterally. Temperature is within normal limits  bilaterally.  Neurologic  Senn-Weinstein monofilament wire test within normal limits  bilaterally. Muscle power within normal limits bilaterally.  Nails Thick disfigured discolored nails with subungual debris  hallux nails  bilaterally. No evidence of bacterial infection or drainage bilaterally.   Orthopedic  No limitations of motion  feet .  No crepitus or effusions noted.  No bony pathology or digital deformities noted. 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  Skin  normotropic skin  noted bilaterally.  Porokeratosis sub 5th left foot asymptomatic.No signs of infections or ulcers noted.     Onychomycosis  Nails  B/L.  Pain in right toes  Pain in left toes  Debridement of nails both feet followed trimming the nails with dremel tool.   RTC 10 weeks    Cordella Bold DPM

## 2024-09-01 ENCOUNTER — Ambulatory Visit: Admitting: Podiatry

## 2024-09-20 ENCOUNTER — Encounter: Payer: Self-pay | Admitting: Podiatry

## 2024-09-20 ENCOUNTER — Ambulatory Visit: Admitting: Podiatry

## 2024-09-20 DIAGNOSIS — M79675 Pain in left toe(s): Secondary | ICD-10-CM

## 2024-09-20 DIAGNOSIS — B351 Tinea unguium: Secondary | ICD-10-CM

## 2024-09-20 DIAGNOSIS — M79674 Pain in right toe(s): Secondary | ICD-10-CM

## 2024-09-20 DIAGNOSIS — E1165 Type 2 diabetes mellitus with hyperglycemia: Secondary | ICD-10-CM

## 2024-09-20 NOTE — Progress Notes (Signed)
 This patient presents to the office with chief complaint of long thick painful nails.  Patient says the nails are painful walking and wearing shoes.  This patient is unable to self treat.  This patient is unable to trim her nails since she is unable to reach her nails. She has pain on top of tendon right big toe. She presents to the office for preventative foot care services.  General Appearance  Alert, conversant and in no acute stress.  Vascular  Dorsalis pedis and posterior tibial  pulses are palpable  bilaterally.  Capillary return is within normal limits  bilaterally. Temperature is within normal limits  bilaterally.  Neurologic  Senn-Weinstein monofilament wire test within normal limits  bilaterally. Muscle power within normal limits bilaterally.  Nails Thick disfigured discolored nails with subungual debris  hallux nails  bilaterally. No evidence of bacterial infection or drainage bilaterally.   Orthopedic  No limitations of motion  feet .  No crepitus or effusions noted.  No bony pathology or digital deformities noted. 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  Skin  normotropic skin  noted bilaterally.  Porokeratosis sub 5th left foot asymptomatic.No signs of infections or ulcers noted.     Onychomycosis  Nails  B/L.  Pain in right toes  Pain in left toes  Debridement of nails both feet followed trimming the nails with dremel tool.   RTC 10 weeks    Cordella Bold DPM

## 2024-11-29 ENCOUNTER — Ambulatory Visit: Admitting: Podiatry
